# Patient Record
Sex: Female | Born: 1955 | Race: White | Hispanic: No | Marital: Married | Smoking: Former smoker
Health system: Southern US, Community
[De-identification: ages and names within clinical notes are randomized; demographics above are authoritative.]

## PROBLEM LIST (undated history)

## (undated) DIAGNOSIS — F419 Anxiety disorder, unspecified: Secondary | ICD-10-CM

## (undated) DIAGNOSIS — J45909 Unspecified asthma, uncomplicated: Secondary | ICD-10-CM

## (undated) DIAGNOSIS — M545 Low back pain, unspecified: Secondary | ICD-10-CM

## (undated) DIAGNOSIS — Z85048 Personal history of other malignant neoplasm of rectum, rectosigmoid junction, and anus: Secondary | ICD-10-CM

## (undated) DIAGNOSIS — G4700 Insomnia, unspecified: Secondary | ICD-10-CM

## (undated) HISTORY — PX: CERVICAL FUSION: SHX112

## (undated) HISTORY — PX: BREAST SURGERY: SHX581

## (undated) HISTORY — DX: Low back pain: M54.5

## (undated) HISTORY — DX: Insomnia, unspecified: G47.00

## (undated) HISTORY — DX: Personal history of other malignant neoplasm of rectum, rectosigmoid junction, and anus: Z85.048

## (undated) HISTORY — PX: RECTAL SURGERY: SHX760

## (undated) HISTORY — DX: Low back pain, unspecified: M54.50

## (undated) HISTORY — DX: Anxiety disorder, unspecified: F41.9

## (undated) HISTORY — PX: CATARACT EXTRACTION: SUR2

---

## 2011-12-11 ENCOUNTER — Encounter: Payer: Self-pay | Admitting: Internal Medicine

## 2011-12-11 DIAGNOSIS — C20 Malignant neoplasm of rectum: Secondary | ICD-10-CM

## 2012-01-22 ENCOUNTER — Encounter: Payer: PRIVATE HEALTH INSURANCE | Admitting: Hematology and Oncology

## 2012-01-27 DIAGNOSIS — Z452 Encounter for adjustment and management of vascular access device: Secondary | ICD-10-CM

## 2012-01-27 DIAGNOSIS — C20 Malignant neoplasm of rectum: Secondary | ICD-10-CM

## 2012-02-11 ENCOUNTER — Encounter (INDEPENDENT_AMBULATORY_CARE_PROVIDER_SITE_OTHER): Payer: PRIVATE HEALTH INSURANCE | Admitting: Internal Medicine

## 2012-02-11 DIAGNOSIS — G8929 Other chronic pain: Secondary | ICD-10-CM

## 2012-02-11 DIAGNOSIS — C210 Malignant neoplasm of anus, unspecified: Secondary | ICD-10-CM

## 2012-04-16 DIAGNOSIS — Z452 Encounter for adjustment and management of vascular access device: Secondary | ICD-10-CM

## 2012-04-16 DIAGNOSIS — C20 Malignant neoplasm of rectum: Secondary | ICD-10-CM

## 2012-05-31 DIAGNOSIS — C20 Malignant neoplasm of rectum: Secondary | ICD-10-CM

## 2012-05-31 DIAGNOSIS — Z452 Encounter for adjustment and management of vascular access device: Secondary | ICD-10-CM

## 2012-11-17 ENCOUNTER — Encounter: Payer: PRIVATE HEALTH INSURANCE | Admitting: Internal Medicine

## 2012-12-06 ENCOUNTER — Encounter: Payer: PRIVATE HEALTH INSURANCE | Admitting: Internal Medicine

## 2013-01-04 ENCOUNTER — Encounter (INDEPENDENT_AMBULATORY_CARE_PROVIDER_SITE_OTHER): Payer: PRIVATE HEALTH INSURANCE | Admitting: Internal Medicine

## 2014-12-06 ENCOUNTER — Emergency Department (HOSPITAL_COMMUNITY): Payer: Worker's Compensation

## 2014-12-06 ENCOUNTER — Encounter (HOSPITAL_COMMUNITY): Payer: Self-pay | Admitting: Emergency Medicine

## 2014-12-06 ENCOUNTER — Inpatient Hospital Stay (HOSPITAL_COMMUNITY)
Admission: EM | Admit: 2014-12-06 | Discharge: 2014-12-06 | DRG: 916 | Disposition: A | Discharge status: Home / Self Care | Payer: Worker's Compensation | Attending: Emergency Medicine | Admitting: Emergency Medicine

## 2014-12-06 DIAGNOSIS — F17210 Nicotine dependence, cigarettes, uncomplicated: Secondary | ICD-10-CM | POA: Diagnosis present

## 2014-12-06 DIAGNOSIS — Z720 Tobacco use: Secondary | ICD-10-CM | POA: Insufficient documentation

## 2014-12-06 DIAGNOSIS — I9589 Other hypotension: Secondary | ICD-10-CM | POA: Insufficient documentation

## 2014-12-06 DIAGNOSIS — R7989 Other specified abnormal findings of blood chemistry: Secondary | ICD-10-CM

## 2014-12-06 DIAGNOSIS — Z8249 Family history of ischemic heart disease and other diseases of the circulatory system: Secondary | ICD-10-CM

## 2014-12-06 DIAGNOSIS — T782XXA Anaphylactic shock, unspecified, initial encounter: Principal | ICD-10-CM | POA: Diagnosis present

## 2014-12-06 DIAGNOSIS — J45909 Unspecified asthma, uncomplicated: Secondary | ICD-10-CM | POA: Diagnosis present

## 2014-12-06 DIAGNOSIS — R778 Other specified abnormalities of plasma proteins: Secondary | ICD-10-CM | POA: Insufficient documentation

## 2014-12-06 DIAGNOSIS — M25559 Pain in unspecified hip: Secondary | ICD-10-CM

## 2014-12-06 HISTORY — DX: Unspecified asthma, uncomplicated: J45.909

## 2014-12-06 LAB — CBC WITH DIFFERENTIAL/PLATELET
BASOS PCT: 0 % (ref 0–1)
Basophils Absolute: 0 10*3/uL (ref 0.0–0.1)
Eosinophils Absolute: 0.1 10*3/uL (ref 0.0–0.7)
Eosinophils Relative: 0 % (ref 0–5)
HCT: 38.9 % (ref 36.0–46.0)
HEMOGLOBIN: 12.8 g/dL (ref 12.0–15.0)
LYMPHS ABS: 2.5 10*3/uL (ref 0.7–4.0)
Lymphocytes Relative: 22 % (ref 12–46)
MCH: 30.7 pg (ref 26.0–34.0)
MCHC: 32.9 g/dL (ref 30.0–36.0)
MCV: 93.3 fL (ref 78.0–100.0)
Monocytes Absolute: 0.4 10*3/uL (ref 0.1–1.0)
Monocytes Relative: 4 % (ref 3–12)
NEUTROS ABS: 8.6 10*3/uL — AB (ref 1.7–7.7)
Neutrophils Relative %: 74 % (ref 43–77)
Platelets: 295 10*3/uL (ref 150–400)
RBC: 4.17 MIL/uL (ref 3.87–5.11)
RDW: 13.5 % (ref 11.5–15.5)
WBC: 11.5 10*3/uL — ABNORMAL HIGH (ref 4.0–10.5)

## 2014-12-06 LAB — I-STAT CHEM 8, ED
BUN: 21 mg/dL — AB (ref 6–20)
CALCIUM ION: 1.16 mmol/L (ref 1.13–1.30)
Chloride: 104 mmol/L (ref 101–111)
Creatinine, Ser: 1 mg/dL (ref 0.44–1.00)
Glucose, Bld: 232 mg/dL — ABNORMAL HIGH (ref 70–99)
HEMATOCRIT: 40 % (ref 36.0–46.0)
Hemoglobin: 13.6 g/dL (ref 12.0–15.0)
Potassium: 3.3 mmol/L — ABNORMAL LOW (ref 3.5–5.1)
Sodium: 141 mmol/L (ref 135–145)
TCO2: 21 mmol/L (ref 0–100)

## 2014-12-06 LAB — RAPID URINE DRUG SCREEN, HOSP PERFORMED
Amphetamines: NOT DETECTED
Barbiturates: NOT DETECTED
Benzodiazepines: POSITIVE — AB
COCAINE: NOT DETECTED
OPIATES: NOT DETECTED
Tetrahydrocannabinol: NOT DETECTED

## 2014-12-06 LAB — BLOOD GAS, ARTERIAL
Acid-base deficit: 5.4 mmol/L — ABNORMAL HIGH (ref 0.0–2.0)
Bicarbonate: 19.8 mEq/L — ABNORMAL LOW (ref 20.0–24.0)
DRAWN BY: 25788
FIO2: 100 %
O2 SAT: 99 %
PCO2 ART: 40.8 mmHg (ref 35.0–45.0)
Patient temperature: 37
TCO2: 18.3 mmol/L (ref 0–100)
pH, Arterial: 7.306 — ABNORMAL LOW (ref 7.350–7.450)
pO2, Arterial: 273 mmHg — ABNORMAL HIGH (ref 80.0–100.0)

## 2014-12-06 LAB — COMPREHENSIVE METABOLIC PANEL
ALK PHOS: 43 U/L (ref 38–126)
ALT: 16 U/L (ref 14–54)
AST: 21 U/L (ref 15–41)
Albumin: 3.2 g/dL — ABNORMAL LOW (ref 3.5–5.0)
Anion gap: 8 (ref 5–15)
BUN: 21 mg/dL — ABNORMAL HIGH (ref 6–20)
CALCIUM: 8.9 mg/dL (ref 8.9–10.3)
CO2: 24 mmol/L (ref 22–32)
CREATININE: 0.97 mg/dL (ref 0.44–1.00)
Chloride: 107 mmol/L (ref 101–111)
GFR calc non Af Amer: >60 mL/min (ref >60)
GLUCOSE: 227 mg/dL — AB (ref 70–99)
Potassium: 3.7 mmol/L (ref 3.5–5.1)
Sodium: 139 mmol/L (ref 135–145)
Total Bilirubin: 0.4 mg/dL (ref 0.3–1.2)
Total Protein: 5.3 g/dL — ABNORMAL LOW (ref 6.5–8.1)

## 2014-12-06 LAB — URINALYSIS, ROUTINE W REFLEX MICROSCOPIC
BILIRUBIN URINE: NEGATIVE
Glucose, UA: 100 mg/dL — AB
Ketones, ur: NEGATIVE mg/dL
Nitrite: NEGATIVE
Protein, ur: NEGATIVE mg/dL
Specific Gravity, Urine: 1.02 (ref 1.005–1.030)
UROBILINOGEN UA: 0.2 mg/dL (ref 0.0–1.0)
pH: 5.5 (ref 5.0–8.0)

## 2014-12-06 LAB — TROPONIN I: Troponin I: <0.03 ng/mL (ref <0.031)

## 2014-12-06 LAB — I-STAT CG4 LACTIC ACID, ED: LACTIC ACID, VENOUS: 1.92 mmol/L (ref 0.5–2.0)

## 2014-12-06 LAB — URINE MICROSCOPIC-ADD ON

## 2014-12-06 LAB — PROTIME-INR
INR: 1.02 (ref 0.00–1.49)
Prothrombin Time: 13.5 seconds (ref 11.6–15.2)

## 2014-12-06 LAB — I-STAT TROPONIN, ED: Troponin i, poc: 0.15 ng/mL (ref 0.00–0.08)

## 2014-12-06 LAB — CK: Total CK: 94 U/L (ref 38–234)

## 2014-12-06 LAB — ACETAMINOPHEN LEVEL: Acetaminophen (Tylenol), Serum: <10 ug/mL — ABNORMAL LOW (ref 10–30)

## 2014-12-06 LAB — SALICYLATE LEVEL: Salicylate Lvl: <4 mg/dL (ref 2.8–30.0)

## 2014-12-06 LAB — ETHANOL: Alcohol, Ethyl (B): <5 mg/dL (ref <5)

## 2014-12-06 MED ORDER — EPINEPHRINE 0.3 MG/0.3ML IJ SOAJ
0.3000 mg | Freq: Once | INTRAMUSCULAR | Status: AC
  Administered 2014-12-06: 0.3 mg via INTRAMUSCULAR

## 2014-12-06 MED ORDER — PREDNISONE 50 MG PO TABS: ORAL_TABLET | ORAL | Status: DC

## 2014-12-06 MED ORDER — DIPHENHYDRAMINE HCL 50 MG/ML IJ SOLN
25.0000 mg | Freq: Once | INTRAMUSCULAR | Status: AC
  Administered 2014-12-06: 25 mg via INTRAVENOUS
  Filled 2014-12-06: qty 1

## 2014-12-06 MED ORDER — SODIUM CHLORIDE 0.9 % IV SOLN: INTRAVENOUS | Status: DC

## 2014-12-06 MED ORDER — SODIUM CHLORIDE 0.9 % IV BOLUS (SEPSIS)
1000.0000 mL | Freq: Once | INTRAVENOUS | Status: AC
  Administered 2014-12-06: 1000 mL via INTRAVENOUS

## 2014-12-06 MED ORDER — EPINEPHRINE 0.3 MG/0.3ML IJ SOAJ
INTRAMUSCULAR | Status: AC
  Filled 2014-12-06: qty 0.3

## 2014-12-06 MED ORDER — SODIUM CHLORIDE 0.9 % IV SOLN
Freq: Once | INTRAVENOUS | Status: AC
  Administered 2014-12-06: 14:00:00 via INTRAVENOUS

## 2014-12-06 MED ORDER — ASPIRIN 81 MG PO CHEW
324.0000 mg | CHEWABLE_TABLET | Freq: Once | ORAL | Status: AC
  Administered 2014-12-06: 324 mg via ORAL
  Filled 2014-12-06: qty 4

## 2014-12-06 MED ORDER — SODIUM CHLORIDE 0.9 % IV BOLUS (SEPSIS): 1000.0000 mL | Freq: Once | INTRAVENOUS | Status: DC

## 2014-12-06 MED ORDER — FAMOTIDINE IN NACL 20-0.9 MG/50ML-% IV SOLN
20.0000 mg | Freq: Once | INTRAVENOUS | Status: AC
Start: 2014-12-06 — End: 2014-12-06
  Administered 2014-12-06: 20 mg via INTRAVENOUS

## 2014-12-06 MED ORDER — EPINEPHRINE 0.3 MG/0.3ML IJ SOAJ: 0.3000 mg | Freq: Once | INTRAMUSCULAR | Status: AC

## 2014-12-06 MED ORDER — METHYLPREDNISOLONE SODIUM SUCC 125 MG IJ SOLR
INTRAMUSCULAR | Status: AC
  Filled 2014-12-06: qty 2

## 2014-12-06 MED ORDER — METHYLPREDNISOLONE SODIUM SUCC 125 MG IJ SOLR
125.0000 mg | Freq: Once | INTRAMUSCULAR | Status: AC
  Administered 2014-12-06: 125 mg via INTRAVENOUS

## 2014-12-06 MED ORDER — FAMOTIDINE IN NACL 20-0.9 MG/50ML-% IV SOLN
INTRAVENOUS | Status: AC
  Filled 2014-12-06: qty 50

## 2014-12-06 NOTE — ED Notes (Signed)
Pt was at a [employer] camp, was found outside minimally responsive. Pt with reddened skin, hypotension and lethargic. Pt received Benadryl and zofran en route with EMS.

## 2014-12-06 NOTE — ED Notes (Signed)
X-ray at bedside

## 2014-12-06 NOTE — ED Notes (Signed)
Gave pt blue paper scrubs to wear home.

## 2014-12-06 NOTE — ED Notes (Signed)
Went to inform patient about bed placement. Pt states she wants to go home. MD Marily Memos paged. States he will come down and see patient. Attempting to notify step-down.

## 2014-12-06 NOTE — ED Notes (Signed)
Respiratory switched pt from NRB to 4L [place]. Pt sats 100%

## 2014-12-06 NOTE — ED Provider Notes (Signed)
CSN: [id number]     Arrival date & time 12/06/14  1253 History   First MD Initiated Contact with Patient 12/06/14 1309     Chief Complaint  Patient presents with  . Allergic Reaction     (Consider location/radiation/quality/duration/timing/severity/associated sxs/prior Treatment) HPI Comments: level V caveat for acuity of condition. 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 She then developed itching in her skin with difficulty breathing. EMS was called and found her minimally responsive with hypotension and lethargy. They gave her Benadryl and Zofran. She arrives confused, hypotensive complaining of right hip pain. She denies falling. She denies any chest pain or shortness of breath. She denies any known allergies other than penicillin. She had abdominal pain with nausea vomiting and diarrhea. She feels she felt well previous to today.   The history is provided by the patient. The history is limited by the condition of the patient.    Past Medical History  Diagnosis Date  . Asthma    Past Surgical History  Procedure Laterality Date  . Cervical fusion     Family History  Problem Relation Age of Onset  . Heart failure Father   . Heart failure Mother    History  Substance Use Topics  . Smoking status: Current Every Day Smoker -- 1.00 packs/day    Types: Cigarettes  . Smokeless tobacco: Current User  . Alcohol Use: No   OB History    Gravida Para Term Preterm AB TAB SAB Ectopic Multiple Living   2 2 2             Review of Systems  Unable to perform ROS: Acuity of condition      Allergies  Levaquin; Rocephin; and Penicillins  Home Medications   Prior to Admission medications   Medication Sig Start Date End Date Taking? Authorizing Provider  acetaminophen (TYLENOL) 500 MG tablet Take 1,000 mg by mouth every 6 (six) hours as needed for mild pain or headache.   Yes Historical  Provider, MD  albuterol (PROVENTIL HFA;VENTOLIN HFA) 108 (90 BASE) MCG/ACT inhaler Inhale 2 puffs into the lungs every 6 (six) hours as needed for wheezing or shortness of breath.   Yes Historical Provider, MD  ALPRAZolam Duanne Moron) 1 MG tablet Take 1 mg by mouth at bedtime.   Yes Historical Provider, MD  Fluticasone-Salmeterol (ADVAIR) 250-50 MCG/DOSE AEPB Inhale 1 puff into the lungs 2 (two) times daily.   Yes Historical Provider, MD  HYDROcodone-acetaminophen (NORCO/VICODIN) 5-325 MG per tablet Take 1 tablet by mouth daily as needed for moderate pain.   Yes Historical Provider, MD  meclizine (ANTIVERT) 25 MG tablet Take 25 mg by mouth daily as needed for nausea.   Yes Historical Provider, MD  omeprazole (PRILOSEC) 20 MG capsule Take 20 mg by mouth daily as needed (acid reflux).   Yes Historical Provider, MD  traZODone (DESYREL) 100 MG tablet Take 100 mg by mouth at bedtime.   Yes Historical Provider, MD  EPINEPHrine 0.3 mg/0.3 mL IJ SOAJ injection Inject 0.3 mLs (0.3 mg total) into the muscle once. For severe allergic reaction 12/06/14   Ezequiel Essex, MD  predniSONE (DELTASONE) 50 MG tablet 1 tablet PO daily 12/06/14   Ezequiel Essex, MD   BP 104/57 mmHg  Pulse 88  Temp(Src) 97.8 F (36.6 C) (Oral)  Resp 20  Ht 5' (1.524 m)  Wt 124 lb (56.246 kg)  BMI 24.22 kg/m2  SpO2 98% Physical Exam  Constitutional: She is oriented to person, place, and time. She appears well-developed and well-nourished.  Distressed, erythematous skin, diaphoretic, hypotensive  HENT:  Head: Normocephalic and atraumatic.  Mouth/Throat: Oropharynx is clear and moist. No oropharyngeal exudate.  Oropharynx clear. Uvula midline. No tongue or lip swelling  Eyes: Conjunctivae and EOM are normal. Pupils are equal, round, and reactive to light.  Neck: Normal range of motion. Neck supple.  Cardiovascular: Normal rate and normal heart sounds.   Pulmonary/Chest: Effort normal and breath sounds normal. No respiratory  distress.  Abdominal: Soft. There is tenderness.  Incontinent of stool  Musculoskeletal: Normal range of motion. She exhibits tenderness. She exhibits no edema.  Tenderness with  palpation and range of motion of right hip  Neurological: She is alert and oriented to person, place, and time. No cranial nerve deficit. Coordination normal.  Skin: Skin is warm. Rash noted. She is diaphoretic.    ED Course  Procedures (including critical care time) Labs Review Labs Reviewed  CBC WITH DIFFERENTIAL/PLATELET - Abnormal; Notable for the following:    WBC 11.5 (*)    Neutro Abs 8.6 (*)    All other components within normal limits  COMPREHENSIVE METABOLIC PANEL - Abnormal; Notable for the following:    Glucose, Bld 227 (*)    BUN 21 (*)    Total Protein 5.3 (*)    Albumin 3.2 (*)    All other components within normal limits  URINALYSIS, ROUTINE W REFLEX MICROSCOPIC - Abnormal; Notable for the following:    Glucose, UA 100 (*)    Hgb urine dipstick TRACE (*)    Leukocytes, UA TRACE (*)    All other components within normal limits  ACETAMINOPHEN LEVEL - Abnormal; Notable for the following:    Acetaminophen (Tylenol), Serum <10 (*)    All other components within normal limits  URINE RAPID DRUG SCREEN (HOSP PERFORMED) - Abnormal; Notable for the following:    Benzodiazepines POSITIVE (*)    All other components within normal limits  BLOOD GAS, ARTERIAL - Abnormal; Notable for the following:    pH, Arterial 7.306 (*)    pO2, Arterial 273 (*)    Bicarbonate 19.8 (*)    Acid-base deficit 5.4 (*)    All other components within normal limits  URINE MICROSCOPIC-ADD ON - Abnormal; Notable for the following:    Squamous Epithelial / LPF FEW (*)    Bacteria, UA MANY (*)    All other components within normal limits  I-STAT CHEM 8, ED - Abnormal; Notable for the following:    Potassium 3.3 (*)    BUN 21 (*)    Glucose, Bld 232 (*)    All other components within normal limits  I-STAT TROPOININ,  ED - Abnormal; Notable for the following:    Troponin i, poc 0.15 (*)    All other components within normal limits  PROTIME-INR  SALICYLATE LEVEL  ETHANOL  CK  TROPONIN I  I-STAT CG4 LACTIC ACID, ED    Imaging Review Dg Chest Portable 1 View  12/06/2014   CLINICAL DATA:  Shortness of breath with hypotension.  EXAM: PORTABLE CHEST - 1 VIEW  COMPARISON:  Chest radiograph May 23, 2014 and chest CT September 04, 2014  FINDINGS: There is no appreciable edema or consolidation. Heart size and pulmonary vascularity are normal. No adenopathy. No bone lesions.  IMPRESSION: No edema or consolidation.   Electronically Signed   By: Lowella Grip III M.D.   On: 12/06/2014  14:07   Dg Hip Port Unilat With Pelvis 1v Right  12/06/2014   CLINICAL DATA:  Right hip pain.  Unsure of trauma history.  EXAM: RIGHT HIP (WITH PELVIS) 3 VIEW PORTABLE  COMPARISON:  None.  FINDINGS: Frontal pelvis as well as frontal and lateral right hip images were obtained. There is no demonstrable acute fracture or dislocation. Joint spaces appear intact. No erosive change.  IMPRESSION: No fracture or dislocation.  No appreciable arthropathy.   Electronically Signed   By: Lowella Grip III M.D.   On: 12/06/2014 14:04     EKG Interpretation   Date/Time:  Wednesday Dec 06 2014 13:07:07 EDT Ventricular Rate:  89 PR Interval:  148 QRS Duration: 88 QT Interval:  393 QTC Calculation: 478 R Axis:   68 Text Interpretation:  Sinus rhythm Minimal ST depression, inferior leads  No previous ECGs available Confirmed by Wyvonnia Dusky  MD, Hadley Soileau [phone]) on  12/06/2014 1:27:42 PM      MDM   Final diagnoses:  Hip pain  Anaphylactic shock, initial encounter   Patient with concern for anaphylaxis and shock. She is given IM epinephrine, fluids, steroids, antihistamines.  Patient has improved with above therapy. Blood pressure has normalized after becoming hypertensive. Chest x-rays negative. She is not wheezing. Airway is intact.  EKG is normal sinus rhythm. Troponin mildly elevated at 0.15. She denies any chest pain or shortness of breath.  Lab troponin is negative.  Suspect lab irregularity versus mild demand ischemia from her reaction.  No EKG changes.  BP has normalized  Patient feels at her baseline. She denies SOB or CP.    patient adamantly refusing admission. Discussed at bedside. Dr. Marily Memos the hospitalist has discussed with her as well. We believe she is at risk for rebound anaphylactic reaction. Her troponin is positive indicating some myocardial damage from previous reaction. She states she lives 5 minutes from the hospital and wishes to go home. She understands she could be at risk for decompensation and death. She appears to have capacity to make medical decisions and will leave [place].   She'll be given epinephrine pen and steroids. Encouraged to return if she changes her mind or develops recurring symptoms.   CRITICAL CARE Performed by: Ezequiel Essex Total critical care time: 45 Critical care time was exclusive of separately billable procedures and treating other patients. Critical care was necessary to treat or prevent imminent or life-threatening deterioration. Critical care was time spent personally by me on the following activities: development of treatment plan with patient and/or surrogate as well as nursing, discussions with consultants, evaluation of patient's response to treatment, examination of patient, obtaining history from patient or surrogate, ordering and performing treatments and interventions, ordering and review of laboratory studies, ordering and review of radiographic studies, pulse oximetry and re-evaluation of patient's condition.    Ezequiel Essex, MD 12/07/14 [phone]

## 2014-12-06 NOTE — Consult Note (Signed)
[facility] Consult Note  Annette Chavez XBW:[id number] DOB: Dec 09, 1955 DOA: 12/06/2014  Referring physician: Dr Wyvonnia Dusky - [facility] PCP: No PCP Per Patient   Chief Complaint: Anaphylaxis   HPI: Annette Chavez is a 59 y.o. female  Patient reports acute onset symptoms earlier today. Patient reports walking outside and pulling a few did flowers off of a bush. A few minutes later patient developed significant tingling in her fingers and toes and lips. Digits appeared change colors. Patient reports speaking to somebody about this as she started to "feel funny and lethargic. "Patient states she does not remember anything else after that point in time. Per patient's husband and report patient became minimally responsive at that point in time EMS was called and found her to be severely hypotensive. Patient was unable to give a reliable history that point time. Per my evaluation in the emergency room patient is status post epinephrine, steroids, 3 L normal saline bolus, Benadryl. Patient is significantly improved and feels at her baseline. Patient is without complaint at this point in time.  She reports being in her normal state of health prior to this event.  Review of Systems:  Constitutional:  No weight loss, night sweats, Fevers, chills, fatigue.  HEENT:  No headaches, Difficulty swallowing,Tooth/dental problems,Sore throat,  No sneezing, itching, ear ache, nasal congestion, post nasal drip,  Cardio-vascular:  No chest pain, Orthopnea, PND, swelling in lower extremities, anasarca, dizziness, GI:  No heartburn, indigestion, abdominal pain, nausea, vomiting, diarrhea, change in bowel habits, loss of appetite  Resp:   No excess mucus, no productive cough, No non-productive cough, No coughing up of blood.No change in color of mucus.No wheezing.No chest wall deformity  Skin:  no rash or lesions.  GU:  no dysuria, change in color of urine, no urgency or frequency. No flank pain.    Musculoskeletal:   No joint pain or swelling. No decreased range of motion. No back pain.  Psych:  No change in mood or affect. No depression or anxiety. No memory loss.   Past Medical History  Diagnosis Date  . Asthma    Past Surgical History  Procedure Laterality Date  . Cervical fusion     Social History:  reports that she has been smoking Cigarettes.  She has been smoking about 1.00 pack per day. She does not have any smokeless tobacco history on file. She reports that she does not drink alcohol or use illicit drugs.  Allergies  Allergen Reactions  . Levaquin [Levofloxacin]     High blood pressure, convulsing   . Rocephin [Ceftriaxone]     High blood pressure, convulsing  . Penicillins Rash    No family history on file.   Prior to Admission medications   Medication Sig Start Date End Date Taking? Authorizing Provider  acetaminophen (TYLENOL) 500 MG tablet Take 1,000 mg by mouth every 6 (six) hours as needed for mild pain or headache.   Yes Historical Provider, MD  albuterol (PROVENTIL HFA;VENTOLIN HFA) 108 (90 BASE) MCG/ACT inhaler Inhale 2 puffs into the lungs every 6 (six) hours as needed for wheezing or shortness of breath.   Yes Historical Provider, MD  ALPRAZolam Duanne Moron) 1 MG tablet Take 1 mg by mouth at bedtime.   Yes Historical Provider, MD  Fluticasone-Salmeterol (ADVAIR) 250-50 MCG/DOSE AEPB Inhale 1 puff into the lungs 2 (two) times daily.   Yes Historical Provider, MD  HYDROcodone-acetaminophen (NORCO/VICODIN) 5-325 MG per tablet Take 1 tablet by mouth daily as needed for moderate pain.  Yes Historical Provider, MD  meclizine (ANTIVERT) 25 MG tablet Take 25 mg by mouth daily as needed for nausea.   Yes Historical Provider, MD  omeprazole (PRILOSEC) 20 MG capsule Take 20 mg by mouth daily as needed (acid reflux).   Yes Historical Provider, MD  traZODone (DESYREL) 100 MG tablet Take 100 mg by mouth at bedtime.   Yes Historical Provider, MD   Physical Exam: Filed  Vitals:   12/06/14 1445 12/06/14 1500 12/06/14 1509 12/06/14 1515  BP: 110/54 108/53 108/53 107/56  Pulse: 91 90 91 91  Temp:      TempSrc:      Resp: 20 18 18 18   Height:      Weight:      SpO2: 100% 100% 99% 100%    Wt Readings from Last 3 Encounters:  12/06/14 56.246 kg (124 lb)    General:  Appears calm and comfortable Eyes:  PERRL, normal lids, irises & conjunctiva ENT: Dry mucous membranes Neck:  no LAD, masses or thyromegaly Cardiovascular:  RRR, no m/r/g. No LE edema. Telemetry:  SR, no arrhythmias  Respiratory:  CTA bilaterally, no w/r/r. Normal respiratory effort. Abdomen:  soft, ntnd Skin:  no rash or induration seen on limited exam Musculoskeletal:  grossly normal tone BUE/BLE Psychiatric:  grossly normal mood and affect, speech fluent and appropriate Neurologic:  grossly non-focal.          Labs on Admission:  Basic Metabolic Panel:  Recent Labs Lab 12/06/14 1328 12/06/14 1329  NA 139 141  K 3.7 3.3*  CL 107 104  CO2 24  --   GLUCOSE 227* 232*  BUN 21* 21*  CREATININE 0.97 1.00  CALCIUM 8.9  --    Liver Function Tests:  Recent Labs Lab 12/06/14 1328  AST 21  ALT 16  ALKPHOS 43  BILITOT 0.4  PROT 5.3*  ALBUMIN 3.2*   No results for input(s): LIPASE, AMYLASE in the last 168 hours. No results for input(s): AMMONIA in the last 168 hours. CBC:  Recent Labs Lab 12/06/14 1328 12/06/14 1329  WBC 11.5*  --   NEUTROABS 8.6*  --   HGB 12.8 13.6  HCT 38.9 40.0  MCV 93.3  --   PLT 295  --    Cardiac Enzymes:  Recent Labs Lab 12/06/14 1328  CKTOTAL 94  TROPONINI <0.03    BNP (last 3 results) No results for input(s): BNP in the last 8760 hours.  ProBNP (last 3 results) No results for input(s): PROBNP in the last 8760 hours.  CBG: No results for input(s): GLUCAP in the last 168 hours.  Radiological Exams on Admission: Dg Chest Portable 1 View  12/06/2014   CLINICAL DATA:  Shortness of breath with hypotension.  EXAM: PORTABLE  CHEST - 1 VIEW  COMPARISON:  Chest radiograph May 23, 2014 and chest CT September 04, 2014  FINDINGS: There is no appreciable edema or consolidation. Heart size and pulmonary vascularity are normal. No adenopathy. No bone lesions.  IMPRESSION: No edema or consolidation.   Electronically Signed   By: Lowella Grip III M.D.   On: 12/06/2014 14:07   Dg Hip Port Unilat With Pelvis 1v Right  12/06/2014   CLINICAL DATA:  Right hip pain.  Unsure of trauma history.  EXAM: RIGHT HIP (WITH PELVIS) 3 VIEW PORTABLE  COMPARISON:  None.  FINDINGS: Frontal pelvis as well as frontal and lateral right hip images were obtained. There is no demonstrable acute fracture or dislocation. Joint spaces appear intact. No  erosive change.  IMPRESSION: No fracture or dislocation.  No appreciable arthropathy.   Electronically Signed   By: Lowella Grip III M.D.   On: 12/06/2014 14:04    EKG: Independently reviewed. Normal sinus rhythm, no sign of ACS.  Initial EKG from presentation showed nonspecific ST changes and sinus rhythm.  Assessment/Plan Active Problems:   * No active hospital problems. *  Anaphylactic reaction: Patient evaluated and presenting with symptoms of classic severe anaphylaxis. Patient's second anaphylaxis reaction and seems to be worse than the first one per patient and family report. Unknown etiology of the cause but symptoms were rapidly progressive. Patient responded very well to antihistamine, steroid, epinephrine, fluid bolus. Patient appears to be stable and at her baseline at this point in time. Patient is very adamant about wanting to go home. Reiterated multiple times by myself and with ED physician Dr. Kingsley Callander about the seriousness of her reaction and the danger poses to her health and life. Patient clearly understands the risk of death if her symptoms return without having immediate medical attention. Patient requesting to go home [place].   Of note patient with an initial  elevated point-of-care troponin but second serum troponin was normal. A repeat EKG was obtained which showed normal sinus rhythm and without any sign of ACS. Anticipate a falsely elevated point-of-care troponin versus minimal cardiac strain secondary to anaphylaxis. Patient to follow-up with her primary care physician regarding this.  Patient to be discharged home with steroids and an EpiPen.  Family Communication: Husband Disposition Plan: home  Nakiea Metzner Lenna Sciara, MD Family Medicine [facility] [email or web address] Password [email or web address]

## 2014-12-06 NOTE — ED Notes (Signed)
Respiratory at bedside for ABG

## 2014-12-06 NOTE — ED Notes (Signed)
Per Co-worker, pt was out working in the flower bed when she came it and stated that her fingers were tingling. Pt applied hand-sanitizer and then stated that she felt like her lips were tingling. Co-worker states pt ambulated to bathroom and began calling out for help. EMS was then called. Pt and co-worker deny any LOC.

## 2014-12-06 NOTE — ED Notes (Signed)
Pt is becoming more alert. Pt AOx4

## 2014-12-06 NOTE — ED Notes (Signed)
Oxygen dropped to 2L [place] by RT. Oxygen 99% on 2L.

## 2014-12-06 NOTE — ED Notes (Signed)
MD Rancour and MD Marily Memos at bedside.

## 2014-12-06 NOTE — ED Notes (Signed)
Pt's rings removed my RN Tiffany and RN Margreta Journey given to pt's husband.

## 2014-12-06 NOTE — Discharge Instructions (Signed)
Anaphylactic Reaction You are leaving against medical advice.  You may be at risk for recurrent allergic reaction or death. Return to the ED if you wish to be reevaluated. An anaphylactic reaction is a sudden, severe allergic reaction that involves the whole body. It can be life threatening. A hospital stay is often required. People with asthma, eczema, or hay fever are slightly more likely to have an anaphylactic reaction. CAUSES  An anaphylactic reaction may be caused by anything to which you are allergic. After being exposed to the allergic substance, your immune system becomes sensitized to it. When you are exposed to that allergic substance again, an allergic reaction can occur. Common causes of an anaphylactic reaction include:  Medicines.  Foods, especially peanuts, wheat, shellfish, milk, and eggs.  Insect bites or stings.  Blood products.  Chemicals, such as dyes, latex, and contrast material used for imaging tests. SYMPTOMS  When an allergic reaction occurs, the body releases histamine and other substances. These substances cause symptoms such as tightening of the airway. Symptoms often develop within seconds or minutes of exposure. Symptoms may include:  Skin rash or hives.  Itching.  Chest tightness.  Swelling of the eyes, tongue, or lips.  Trouble breathing or swallowing.  Lightheadedness or fainting.  Anxiety or confusion.  Stomach pains, vomiting, or diarrhea.  Nasal congestion.  A fast or irregular heartbeat (palpitations). DIAGNOSIS  Diagnosis is based on your history of recent exposure to allergic substances, your symptoms, and a physical exam. Your caregiver may also perform blood or urine tests to confirm the diagnosis. TREATMENT  Epinephrine medicine is the main treatment for an anaphylactic reaction. Other medicines that may be used for treatment include antihistamines, steroids, and albuterol. In severe cases, fluids and medicine to support blood  pressure may be given through an intravenous line (IV). Even if you improve after treatment, you need to be observed to make sure your condition does not get worse. This may require a stay in the hospital. [place] a medical alert bracelet or necklace stating your allergy.  You and your family must learn how to use an anaphylaxis kit or give an epinephrine injection to temporarily treat an emergency allergic reaction. Always carry your epinephrine injection or anaphylaxis kit with you. This can be lifesaving if you have a severe reaction.  Do not drive or perform tasks after treatment until the medicines used to treat your reaction have worn off, or until your caregiver says it is okay.  If you have hives or a rash:  Take medicines as directed by your caregiver.  You may use an over-the-counter antihistamine (diphenhydramine) as needed.  Apply cold compresses to the skin or take baths in cool water. Avoid hot baths or showers. SEEK MEDICAL CARE IF:   You develop symptoms of an allergic reaction to a new substance. Symptoms may start right away or minutes later.  You develop a rash, hives, or itching.  You develop new symptoms. SEEK IMMEDIATE MEDICAL CARE IF:   You have swelling of the mouth, difficulty breathing, or wheezing.  You have a tight feeling in your chest or throat.  You develop hives, swelling, or itching all over your body.  You develop severe vomiting or diarrhea.  You feel faint or pass out. This is an emergency. Use your epinephrine injection or anaphylaxis kit as you have been instructed. Call your local emergency services (911 in [place] Even if you improve after the injection, you need to be examined  at a hospital emergency department. MAKE SURE YOU:   Understand these instructions.  Will watch your condition.  Will get help right away if you are not doing well or get worse. Document Released: 07/14/2005 Document Revised: 07/19/2013  Document Reviewed: 10/15/2011 [facility] Patient Information 2015 [place], [place]. This information is not intended to replace advice given to you by your health care provider. Make sure you discuss any questions you have with your health care provider.

## 2016-07-04 IMAGING — CR DG CHEST 1V PORT
1 series · 1 of 1 positions shown · non-contrast
Comparison: Chest radiograph May 23, 2014 and chest CT September 04, 2014

CLINICAL DATA: Shortness of breath with hypotension.

EXAM:
PORTABLE CHEST - 1 VIEW

[ap portable]
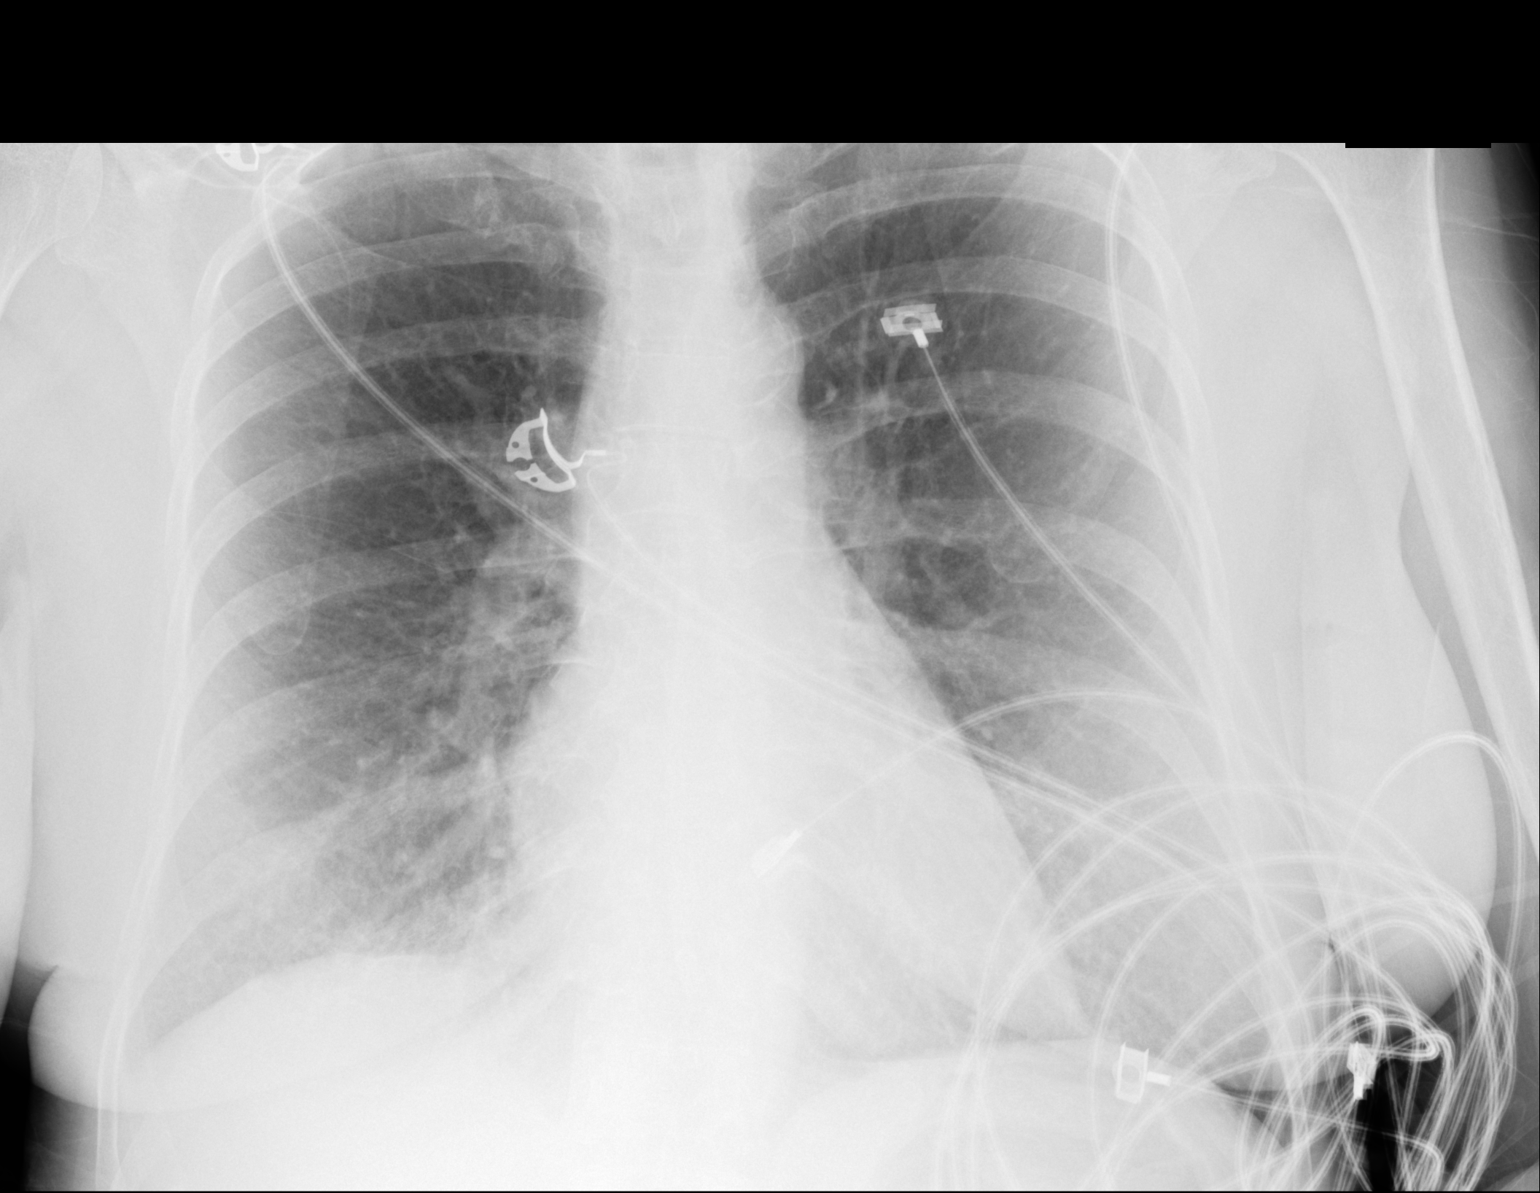

[1 of 1 positions shown; findings below may reference images not displayed]

FINDINGS: There is no appreciable edema or consolidation. Heart size and
pulmonary vascularity are normal. No adenopathy. No bone lesions.
IMPRESSION: No edema or consolidation.

## 2017-09-04 ENCOUNTER — Encounter: Payer: Self-pay | Admitting: Cardiology

## 2017-09-04 NOTE — Progress Notes (Signed)
Cardiology Office Note  Date: 09/07/2017   ID: Rashay, Barnette 1956/07/09, MRN [id number]  PCP: Rosine Door  Consulting cardiologist: Rozann Lesches, MD   Chief Complaint  Patient presents with  . Abnormal GXT    History of Present Illness: Annette Chavez is a 62 y.o. female referred for cardiology consultation by Ms. Skillman PA-C for the assessment of chest pain.  She is here today with her husband.  She tells me that a few months ago she was sitting on her bed talking with her children when she suddenly began to feel a tightness in her chest radiating to the right arm.  This lasted for 10 minutes, she felt weak.  Since then she has not had similar symptoms but has had a much lighter squeezing sensation in her chest that she has attributed to emotional stress.  She recently underwent a GXT on January 14 at [facility].  Study report indicates abnormal ST segment changes at maximum workload of 7 METS, unclear if she experienced chest pain.  Blood pressure response was normal.  There were no arrhythmias.  I personally reviewed copy of ECG from July 15, 2017 which showed normal sinus rhythm.  Current medications are reviewed below.  She does have a family history of premature CAD.  Past Medical History:  Diagnosis Date  . Anxiety   . Asthma   . History of rectal cancer   . Insomnia   . Low back pain     Past Surgical History:  Procedure Laterality Date  . BREAST SURGERY    . CATARACT EXTRACTION Bilateral   . CERVICAL FUSION    . RECTAL SURGERY      Current Outpatient Medications  Medication Sig Dispense Refill  . acetaminophen (TYLENOL) 500 MG tablet Take 1,000 mg by mouth every 6 (six) hours as needed for mild pain or headache.    . albuterol (PROVENTIL HFA;VENTOLIN HFA) 108 (90 BASE) MCG/ACT inhaler Inhale 2 puffs into the lungs every 6 (six) hours as needed for wheezing or shortness of breath.    . ALPRAZolam (XANAX) 1  MG tablet Take 1 mg by mouth at bedtime.    Marland Kitchen EPINEPHrine 0.3 mg/0.3 mL IJ SOAJ injection Inject 0.3 mLs (0.3 mg total) into the muscle once. For severe allergic reaction 1 Device 1  . Fluticasone-Salmeterol (ADVAIR) 250-50 MCG/DOSE AEPB Inhale 1 puff into the lungs 2 (two) times daily.    Marland Kitchen HYDROcodone-acetaminophen (NORCO/VICODIN) 5-325 MG per tablet Take 1 tablet by mouth daily as needed for moderate pain.    Marland Kitchen ipratropium-albuterol (DUONEB) 0.5-2.5 (3) MG/3ML SOLN Take 3 mLs by nebulization.    Rulon Sera IN Inhale into the lungs.    . meclizine (ANTIVERT) 25 MG tablet Take 25 mg by mouth daily as needed for nausea.    . mometasone (NASONEX) 50 MCG/ACT nasal spray Place 2 sprays into the nose daily.    Marland Kitchen omeprazole (PRILOSEC) 20 MG capsule Take 20 mg by mouth daily as needed (acid reflux).    . traZODone (DESYREL) 100 MG tablet Take 100 mg by mouth at bedtime.     No current facility-administered medications for this visit.    Allergies:  Levaquin [levofloxacin]; Rocephin [ceftriaxone]; and Penicillins   Social History: The patient  reports that she has been smoking cigarettes.  She has been smoking about 1.00 pack per day. She uses smokeless tobacco. She reports that she does not drink alcohol or use drugs.  Family History: The patient's family history includes Heart attack in her father and mother.   ROS:  Please see the history of present illness. Otherwise, complete review of systems is positive for anxiety.  All other systems are reviewed and negative.   Physical Exam: VS:  BP 102/62   Pulse 83   Ht 5\' 3"  (1.6 m)   Wt 130 lb (59 kg)   SpO2 98%   BMI 23.03 kg/m , BMI Body mass index is 23.03 kg/m.  Wt Readings from Last 3 Encounters:  09/07/17 130 lb (59 kg)  12/06/14 124 lb (56.2 kg)    General: Patient appears comfortable at rest. HEENT: Conjunctiva and lids normal, oropharynx clear. Neck: Supple, no elevated JVP or carotid bruits, no thyromegaly. Lungs:  Clear to auscultation, nonlabored breathing at rest. Cardiac: Regular rate and rhythm, no S3 or significant systolic murmur, no pericardial rub. Abdomen: Soft, nontender, bowel sounds present, no guarding or rebound. Extremities: No pitting edema, distal pulses 2+. Skin: Warm and dry. Musculoskeletal: No kyphosis. Neuropsychiatric: Alert and oriented x3, affect grossly appropriate.  ECG: I personally reviewed the tracing from 12/06/2014 which showed sinus rhythm with nonspecific ST changes.  Recent Labwork:  December 2018: Cholesterol 189, triglycerides 88, HDL 44, LDL 107, hemoglobin 11.5, platelets 228, BUN 11, creatinine 0.68, potassium 4.1, AST 22, ALT 25, TSH 1.72  Assessment and Plan:  1.  Recurring atypical chest pain following a more intense episode that occurred a few months ago at rest.  Screening GXT was abnormal at [facility] in January.  She has a family history of premature CAD.  We have discussed further diagnostic options and plan is to proceed with a diagnostic cardiac catheterization.  She has reviewed the risks and benefits and is in agreement to proceed.  2.  Family history of premature CAD in both parents.  3.  Anxiety.  Patient states that she worries about "everything."  She is also concerned that emotional stress may have been causing some of her symptoms.  4.  Tobacco abuse.  Smoking cessation discussed.  Current medicines were reviewed with the patient today.  Disposition: Follow-up after procedure.  Signed, Satira Sark, MD, [facility] 09/07/2017 11:23 AM    [facility] [place], [place], [place] [address] Phone: [phone]; Fax: [phone]

## 2017-09-04 NOTE — H&P (View-Only) (Signed)
Cardiology Office Note  Date: 09/07/2017   ID: Annette, Chavez 1956/02/13, MRN [id number]  PCP: Rosine Door  Consulting cardiologist: Rozann Lesches, MD   Chief Complaint  Patient presents with  . Abnormal GXT    History of Present Illness: Annette Chavez is a 62 y.o. female referred for cardiology consultation by Ms. Skillman PA-C for the assessment of chest pain.  She is here today with her husband.  She tells me that a few months ago she was sitting on her bed talking with her children when she suddenly began to feel a tightness in her chest radiating to the right arm.  This lasted for 10 minutes, she felt weak.  Since then she has not had similar symptoms but has had a much lighter squeezing sensation in her chest that she has attributed to emotional stress.  She recently underwent a GXT on January 14 at [facility].  Study report indicates abnormal ST segment changes at maximum workload of 7 METS, unclear if she experienced chest pain.  Blood pressure response was normal.  There were no arrhythmias.  I personally reviewed copy of ECG from July 15, 2017 which showed normal sinus rhythm.  Current medications are reviewed below.  She does have a family history of premature CAD.  Past Medical History:  Diagnosis Date  . Anxiety   . Asthma   . History of rectal cancer   . Insomnia   . Low back pain     Past Surgical History:  Procedure Laterality Date  . BREAST SURGERY    . CATARACT EXTRACTION Bilateral   . CERVICAL FUSION    . RECTAL SURGERY      Current Outpatient Medications  Medication Sig Dispense Refill  . acetaminophen (TYLENOL) 500 MG tablet Take 1,000 mg by mouth every 6 (six) hours as needed for mild pain or headache.    . albuterol (PROVENTIL HFA;VENTOLIN HFA) 108 (90 BASE) MCG/ACT inhaler Inhale 2 puffs into the lungs every 6 (six) hours as needed for wheezing or shortness of breath.    . ALPRAZolam (XANAX) 1  MG tablet Take 1 mg by mouth at bedtime.    Marland Kitchen EPINEPHrine 0.3 mg/0.3 mL IJ SOAJ injection Inject 0.3 mLs (0.3 mg total) into the muscle once. For severe allergic reaction 1 Device 1  . Fluticasone-Salmeterol (ADVAIR) 250-50 MCG/DOSE AEPB Inhale 1 puff into the lungs 2 (two) times daily.    Marland Kitchen HYDROcodone-acetaminophen (NORCO/VICODIN) 5-325 MG per tablet Take 1 tablet by mouth daily as needed for moderate pain.    Marland Kitchen ipratropium-albuterol (DUONEB) 0.5-2.5 (3) MG/3ML SOLN Take 3 mLs by nebulization.    Rulon Sera IN Inhale into the lungs.    . meclizine (ANTIVERT) 25 MG tablet Take 25 mg by mouth daily as needed for nausea.    . mometasone (NASONEX) 50 MCG/ACT nasal spray Place 2 sprays into the nose daily.    Marland Kitchen omeprazole (PRILOSEC) 20 MG capsule Take 20 mg by mouth daily as needed (acid reflux).    . traZODone (DESYREL) 100 MG tablet Take 100 mg by mouth at bedtime.     No current facility-administered medications for this visit.    Allergies:  Levaquin [levofloxacin]; Rocephin [ceftriaxone]; and Penicillins   Social History: The patient  reports that she has been smoking cigarettes.  She has been smoking about 1.00 pack per day. She uses smokeless tobacco. She reports that she does not drink alcohol or use drugs.  Family History: The patient's family history includes Heart attack in her father and mother.   ROS:  Please see the history of present illness. Otherwise, complete review of systems is positive for anxiety.  All other systems are reviewed and negative.   Physical Exam: VS:  BP 102/62   Pulse 83   Ht 5\' 3"  (1.6 m)   Wt 130 lb (59 kg)   SpO2 98%   BMI 23.03 kg/m , BMI Body mass index is 23.03 kg/m.  Wt Readings from Last 3 Encounters:  09/07/17 130 lb (59 kg)  12/06/14 124 lb (56.2 kg)    General: Patient appears comfortable at rest. HEENT: Conjunctiva and lids normal, oropharynx clear. Neck: Supple, no elevated JVP or carotid bruits, no thyromegaly. Lungs:  Clear to auscultation, nonlabored breathing at rest. Cardiac: Regular rate and rhythm, no S3 or significant systolic murmur, no pericardial rub. Abdomen: Soft, nontender, bowel sounds present, no guarding or rebound. Extremities: No pitting edema, distal pulses 2+. Skin: Warm and dry. Musculoskeletal: No kyphosis. Neuropsychiatric: Alert and oriented x3, affect grossly appropriate.  ECG: I personally reviewed the tracing from 12/06/2014 which showed sinus rhythm with nonspecific ST changes.  Recent Labwork:  December 2018: Cholesterol 189, triglycerides 88, HDL 44, LDL 107, hemoglobin 11.5, platelets 228, BUN 11, creatinine 0.68, potassium 4.1, AST 22, ALT 25, TSH 1.72  Assessment and Plan:  1.  Recurring atypical chest pain following a more intense episode that occurred a few months ago at rest.  Screening GXT was abnormal at [facility] in January.  She has a family history of premature CAD.  We have discussed further diagnostic options and plan is to proceed with a diagnostic cardiac catheterization.  She has reviewed the risks and benefits and is in agreement to proceed.  2.  Family history of premature CAD in both parents.  3.  Anxiety.  Patient states that she worries about "everything."  She is also concerned that emotional stress may have been causing some of her symptoms.  4.  Tobacco abuse.  Smoking cessation discussed.  Current medicines were reviewed with the patient today.  Disposition: Follow-up after procedure.  Signed, Satira Sark, MD, [facility] 09/07/2017 11:23 AM    [facility] [place], [place], [place] [address] Phone: [phone]; Fax: [phone]

## 2017-09-07 ENCOUNTER — Telehealth: Payer: Self-pay | Admitting: Cardiology

## 2017-09-07 ENCOUNTER — Encounter: Payer: Self-pay | Admitting: Cardiology

## 2017-09-07 ENCOUNTER — Other Ambulatory Visit: Payer: Self-pay | Admitting: Cardiology

## 2017-09-07 ENCOUNTER — Ambulatory Visit: Payer: PRIVATE HEALTH INSURANCE | Admitting: Cardiology

## 2017-09-07 VITALS — BP 102/62 | HR 83 | Ht 63.0 in | Wt 130.0 lb

## 2017-09-07 DIAGNOSIS — R9430 Abnormal result of cardiovascular function study, unspecified: Secondary | ICD-10-CM | POA: Diagnosis not present

## 2017-09-07 DIAGNOSIS — R0789 Other chest pain: Secondary | ICD-10-CM | POA: Diagnosis not present

## 2017-09-07 DIAGNOSIS — Z720 Tobacco use: Secondary | ICD-10-CM

## 2017-09-07 DIAGNOSIS — Z8249 Family history of ischemic heart disease and other diseases of the circulatory system: Secondary | ICD-10-CM

## 2017-09-07 NOTE — Patient Instructions (Signed)
Your physician recommends that you schedule a follow-up appointment in: [place]  Your physician recommends that you continue on your current medications as directed. Please refer to the Current Medication list given to you today.    [place] [address] Dept: [phone] Loc: [phone]  Annette Chavez  09/07/2017  You are scheduled for a Cardiac Catheterization on Friday, February 15 with Dr. Larae Grooms.  1. Please arrive at the [facility] (Main Entrance A) at [facility]: [place], [place] [address] at 8:30 AM (two hours before your procedure to ensure your preparation). Free valet parking service is available.   Special note: Every effort is made to have your procedure done on time. Please understand that emergencies sometimes delay scheduled procedures.  2. Diet: Do not eat or drink anything after midnight prior to your procedure except sips of water to take medications.  3. Labs: Your labs will be performed at the hospital after you arrive for your procedure.  4. Medication instructions in preparation for your procedure:   *For reference purposes while preparing patient instructions.   Delete this med list prior to printing instructions for patient.*    On the morning of your procedure, take your morning medicines NOT listed above.  You may use sips of water.  5. Plan for one night stay--bring personal belongings. 6. Bring a current list of your medications and current insurance cards. 7. You MUST have a responsible person to drive you home. 8. Someone MUST be with you the first 24 hours after you arrive home or your discharge will be delayed. 9. Please wear clothes that are easy to get on and off and wear slip-on shoes.  Thank you for allowing [place] to care for you!   -- [facility]

## 2017-09-07 NOTE — Telephone Encounter (Signed)
Pre-cert Verification for the following procedure   [facility] 2/15 @10 :30AM DR Irish Lack

## 2017-09-10 ENCOUNTER — Telehealth: Payer: Self-pay | Admitting: *Deleted

## 2017-09-10 NOTE — Telephone Encounter (Signed)
Patient attempted to contact Shellee Milo RN back and was transferred to [facility] office. Went over instructions that were in this encounter. Patient verbalized understanding and will have someone there to drive her. Patient stated she had already informed cath team that she will not be able to be there until 8:30. Patient stated if there was any new information she needed to be given then she can be reached at [phone]

## 2017-09-10 NOTE — Telephone Encounter (Signed)
Heart Cath scheduled at [facility] for: Friday February 15,2019 at 10:30 AM Arrival time and place: [place] at: 8 AM  AM meds can be  taken pre-cath with sip of water including: ASA 81 mg am of cath  Confirm patient has responsible person to drive home post procedure and observe patient for 24 hours  LMTCB to discuss with patient.

## 2017-09-11 ENCOUNTER — Encounter (HOSPITAL_COMMUNITY): Payer: Self-pay | Admitting: Interventional Cardiology

## 2017-09-11 ENCOUNTER — Ambulatory Visit (HOSPITAL_COMMUNITY)
Admission: RE | Admit: 2017-09-11 | Discharge: 2017-09-11 | Disposition: A | Discharge status: Home / Self Care | Payer: PRIVATE HEALTH INSURANCE | Source: Ambulatory Visit | Attending: Interventional Cardiology | Admitting: Interventional Cardiology

## 2017-09-11 ENCOUNTER — Ambulatory Visit (HOSPITAL_COMMUNITY)
Admission: RE | Disposition: A | Discharge status: Home / Self Care | Payer: Self-pay | Source: Ambulatory Visit | Attending: Interventional Cardiology

## 2017-09-11 DIAGNOSIS — F17210 Nicotine dependence, cigarettes, uncomplicated: Secondary | ICD-10-CM | POA: Diagnosis not present

## 2017-09-11 DIAGNOSIS — J45909 Unspecified asthma, uncomplicated: Secondary | ICD-10-CM | POA: Diagnosis not present

## 2017-09-11 DIAGNOSIS — R9430 Abnormal result of cardiovascular function study, unspecified: Secondary | ICD-10-CM | POA: Diagnosis not present

## 2017-09-11 DIAGNOSIS — Z7951 Long term (current) use of inhaled steroids: Secondary | ICD-10-CM | POA: Diagnosis not present

## 2017-09-11 DIAGNOSIS — I2510 Atherosclerotic heart disease of native coronary artery without angina pectoris: Secondary | ICD-10-CM | POA: Insufficient documentation

## 2017-09-11 DIAGNOSIS — R9439 Abnormal result of other cardiovascular function study: Secondary | ICD-10-CM | POA: Diagnosis present

## 2017-09-11 DIAGNOSIS — F419 Anxiety disorder, unspecified: Secondary | ICD-10-CM | POA: Diagnosis not present

## 2017-09-11 DIAGNOSIS — G4700 Insomnia, unspecified: Secondary | ICD-10-CM | POA: Diagnosis not present

## 2017-09-11 DIAGNOSIS — Z880 Allergy status to penicillin: Secondary | ICD-10-CM | POA: Insufficient documentation

## 2017-09-11 DIAGNOSIS — Z8249 Family history of ischemic heart disease and other diseases of the circulatory system: Secondary | ICD-10-CM | POA: Diagnosis not present

## 2017-09-11 HISTORY — PX: LEFT HEART CATH AND CORONARY ANGIOGRAPHY: CATH118249

## 2017-09-11 LAB — PROTIME-INR
INR: 0.9
Prothrombin Time: 12.1 seconds (ref 11.4–15.2)

## 2017-09-11 LAB — BASIC METABOLIC PANEL
Anion gap: 10 (ref 5–15)
BUN: 10 mg/dL (ref 6–20)
CHLORIDE: 106 mmol/L (ref 101–111)
CO2: 26 mmol/L (ref 22–32)
CREATININE: 0.71 mg/dL (ref 0.44–1.00)
Calcium: 9 mg/dL (ref 8.9–10.3)
GFR calc Af Amer: >60 mL/min (ref >60)
GFR calc non Af Amer: >60 mL/min (ref >60)
Glucose, Bld: 91 mg/dL (ref 65–99)
Potassium: 4.2 mmol/L (ref 3.5–5.1)
SODIUM: 142 mmol/L (ref 135–145)

## 2017-09-11 LAB — CBC
HEMATOCRIT: 36.8 % (ref 36.0–46.0)
Hemoglobin: 12.3 g/dL (ref 12.0–15.0)
MCH: 30.1 pg (ref 26.0–34.0)
MCHC: 33.4 g/dL (ref 30.0–36.0)
MCV: 90 fL (ref 78.0–100.0)
PLATELETS: 223 10*3/uL (ref 150–400)
RBC: 4.09 MIL/uL (ref 3.87–5.11)
RDW: 13.9 % (ref 11.5–15.5)
WBC: 7.3 10*3/uL (ref 4.0–10.5)

## 2017-09-11 SURGERY — LEFT HEART CATH AND CORONARY ANGIOGRAPHY
Anesthesia: LOCAL

## 2017-09-11 MED ORDER — FENTANYL CITRATE (PF) 100 MCG/2ML IJ SOLN
INTRAMUSCULAR | Status: DC | PRN
  Administered 2017-09-11 (×3): 25 ug via INTRAVENOUS

## 2017-09-11 MED ORDER — ASPIRIN 81 MG PO CHEW
81.0000 mg | CHEWABLE_TABLET | ORAL | Status: AC
  Administered 2017-09-11: 81 mg via ORAL

## 2017-09-11 MED ORDER — HEPARIN SODIUM (PORCINE) 1000 UNIT/ML IJ SOLN
INTRAMUSCULAR | Status: AC
  Filled 2017-09-11: qty 1

## 2017-09-11 MED ORDER — SODIUM CHLORIDE 0.9% FLUSH: 3.0000 mL | INTRAVENOUS | Status: DC | PRN

## 2017-09-11 MED ORDER — HEPARIN (PORCINE) IN NACL 2-0.9 UNIT/ML-% IJ SOLN
INTRAMUSCULAR | Status: AC
  Filled 2017-09-11: qty 1000

## 2017-09-11 MED ORDER — SODIUM CHLORIDE 0.9 % IV SOLN: 250.0000 mL | INTRAVENOUS | Status: DC | PRN

## 2017-09-11 MED ORDER — IOPAMIDOL (ISOVUE-370) INJECTION 76%
INTRAVENOUS | Status: AC
  Filled 2017-09-11: qty 100

## 2017-09-11 MED ORDER — MIDAZOLAM HCL 2 MG/2ML IJ SOLN
INTRAMUSCULAR | Status: AC
  Filled 2017-09-11: qty 2

## 2017-09-11 MED ORDER — SODIUM CHLORIDE 0.9% FLUSH: 3.0000 mL | Freq: Two times a day (BID) | INTRAVENOUS | Status: DC

## 2017-09-11 MED ORDER — SODIUM CHLORIDE 0.9 % IV SOLN: INTRAVENOUS | Status: AC

## 2017-09-11 MED ORDER — FENTANYL CITRATE (PF) 100 MCG/2ML IJ SOLN
INTRAMUSCULAR | Status: AC
  Filled 2017-09-11: qty 2

## 2017-09-11 MED ORDER — LIDOCAINE HCL (PF) 1 % IJ SOLN
INTRAMUSCULAR | Status: DC | PRN
  Administered 2017-09-11 (×2): 1 mL via SUBCUTANEOUS

## 2017-09-11 MED ORDER — HEPARIN (PORCINE) IN NACL 2-0.9 UNIT/ML-% IJ SOLN
INTRAMUSCULAR | Status: AC | PRN
  Administered 2017-09-11: 1000 mL

## 2017-09-11 MED ORDER — VERAPAMIL HCL 2.5 MG/ML IV SOLN
INTRAVENOUS | Status: AC
  Filled 2017-09-11: qty 2

## 2017-09-11 MED ORDER — SODIUM CHLORIDE 0.9 % WEIGHT BASED INFUSION
3.0000 mL/kg/h | INTRAVENOUS | Status: AC
  Administered 2017-09-11: 3 mL/kg/h via INTRAVENOUS

## 2017-09-11 MED ORDER — SODIUM CHLORIDE 0.9 % WEIGHT BASED INFUSION: 1.0000 mL/kg/h | INTRAVENOUS | Status: DC

## 2017-09-11 MED ORDER — HEPARIN SODIUM (PORCINE) 1000 UNIT/ML IJ SOLN
INTRAMUSCULAR | Status: DC | PRN
  Administered 2017-09-11: 3000 [IU] via INTRAVENOUS

## 2017-09-11 MED ORDER — MIDAZOLAM HCL 2 MG/2ML IJ SOLN
INTRAMUSCULAR | Status: DC | PRN
  Administered 2017-09-11: 2 mg via INTRAVENOUS
  Administered 2017-09-11 (×2): 1 mg via INTRAVENOUS

## 2017-09-11 MED ORDER — VERAPAMIL HCL 2.5 MG/ML IV SOLN
INTRAVENOUS | Status: DC | PRN
  Administered 2017-09-11: 10 mL via INTRA_ARTERIAL

## 2017-09-11 MED ORDER — ASPIRIN 81 MG PO CHEW
CHEWABLE_TABLET | ORAL | Status: AC
  Filled 2017-09-11: qty 1

## 2017-09-11 MED ORDER — IOPAMIDOL (ISOVUE-370) INJECTION 76%
INTRAVENOUS | Status: DC | PRN
  Administered 2017-09-11: 50 mL via INTRA_ARTERIAL

## 2017-09-11 MED ORDER — LIDOCAINE HCL 1 % IJ SOLN
INTRAMUSCULAR | Status: AC
  Filled 2017-09-11: qty 20

## 2017-09-11 SURGICAL SUPPLY — 14 items
CATH INFINITI 4FR JL3.5 (CATHETERS) ×2 IMPLANT
CATH INFINITI 5 FR JL3.5 (CATHETERS) ×2 IMPLANT
CATH INFINITI JR4 5F (CATHETERS) ×2 IMPLANT
COVER PRB 48X5XTLSCP FOLD TPE (BAG) ×1 IMPLANT
COVER PROBE 5X48 (BAG) ×1
DEVICE RAD COMP TR BAND LRG (VASCULAR PRODUCTS) ×2 IMPLANT
GLIDESHEATH SLEND SS 6F .021 (SHEATH) ×2 IMPLANT
GUIDEWIRE INQWIRE 1.5J.035X260 (WIRE) ×1 IMPLANT
INQWIRE 1.5J .035X260CM (WIRE) ×2
KIT HEART LEFT (KITS) ×2 IMPLANT
PACK CARDIAC CATHETERIZATION (CUSTOM PROCEDURE TRAY) ×2 IMPLANT
TRANSDUCER W/STOPCOCK (MISCELLANEOUS) ×2 IMPLANT
TUBING CIL FLEX 10 FLL-RA (TUBING) ×2 IMPLANT
WIRE HI TORQ VERSACORE-J 145CM (WIRE) ×2 IMPLANT

## 2017-09-11 NOTE — Discharge Instructions (Signed)
**Note Annette Chavez-identified via Obfuscation** Radial Site Care °Refer to this sheet in the next few weeks. These instructions provide you with information about caring for yourself after your procedure. Your health care provider may also give you more specific instructions. Your treatment has been planned according to current medical practices, but problems sometimes occur. Call your health care provider if you have any problems or questions after your procedure. °What can I expect after the procedure? °After your procedure, it is typical to have the following: °· Bruising at the radial site that usually fades within 1-2 weeks. °· Blood collecting in the tissue (hematoma) that may be painful to the touch. It should usually decrease in size and tenderness within 1-2 weeks. ° °Follow these instructions at home: °· Take medicines only as directed by your health care provider. °· You may shower 24-48 hours after the procedure or as directed by your health care provider. Remove the bandage (dressing) and gently wash the site with plain soap and water. Pat the area dry with a clean towel. Do not rub the site, because this may cause bleeding. °· Do not take baths, swim, or use a hot tub until your health care provider approves. °· Check your insertion site every day for redness, swelling, or drainage. °· Do not apply powder or lotion to the site. °· Do not flex or bend the affected arm for 24 hours or as directed by your health care provider. °· Do not push or pull heavy objects with the affected arm for 24 hours or as directed by your health care provider. °· Do not lift over 10 lb (4.5 kg) for 5 days after your procedure or as directed by your health care provider. °· Ask your health care provider when it is okay to: °? Return to work or school. °? Resume usual physical activities or sports. °? Resume sexual activity. °· Do not drive home if you are discharged the same day as the procedure. Have someone else drive you. °· You may drive 24 hours after the procedure  unless otherwise instructed by your health care provider. °· Do not operate machinery or power tools for 24 hours after the procedure. °· If your procedure was done as an outpatient procedure, which means that you went home the same day as your procedure, a responsible adult should be with you for the first 24 hours after you arrive home. °· Keep all follow-up visits as directed by your health care provider. This is important. °Contact a health care provider if: °· You have a fever. °· You have chills. °· You have increased bleeding from the radial site. Hold pressure on the site. °Get help right away if: °· You have unusual pain at the radial site. °· You have redness, warmth, or swelling at the radial site. °· You have drainage (other than a small amount of blood on the dressing) from the radial site. °· The radial site is bleeding, and the bleeding does not stop after 30 minutes of holding steady pressure on the site. °· Your arm or hand becomes pale, cool, tingly, or numb. °This information is not intended to replace advice given to you by your health care provider. Make sure you discuss any questions you have with your health care provider. °Document Released: 08/16/2010 Document Revised: 12/20/2015 Document Reviewed: 01/30/2014 °Elsevier Interactive Patient Education © 2018 [employer] ° ° ° °Moderate Conscious Sedation, Adult, Care After °These instructions provide you with information about caring for yourself after your procedure. Your health care provider  **Note Annette Chavez-identified via Obfuscation** may also give you more specific instructions. Your treatment has been planned according to current medical practices, but problems sometimes occur. Call your health care provider if you have any problems or questions after your procedure. °What can I expect after the procedure? °After your procedure, it is common: °· To feel sleepy for several hours. °· To feel clumsy and have poor balance for several hours. °· To have poor judgment for several  hours. °· To vomit if you eat too soon. ° °Follow these instructions at home: °For at least 24 hours after the procedure: ° °· Do not: °? Participate in activities where you could fall or become injured. °? Drive. °? Use heavy machinery. °? Drink alcohol. °? Take sleeping pills or medicines that cause drowsiness. °? Make important decisions or sign legal documents. °? Take care of children on your own. °· Rest. °Eating and drinking °· Follow the diet recommended by your health care provider. °· If you vomit: °? Drink water, juice, or soup when you can drink without vomiting. °? Make sure you have little or no nausea before eating solid foods. °General instructions °· Have a responsible adult stay with you until you are awake and alert. °· Take over-the-counter and prescription medicines only as told by your health care provider. °· If you smoke, do not smoke without supervision. °· Keep all follow-up visits as told by your health care provider. This is important. °Contact a health care provider if: °· You keep feeling nauseous or you keep vomiting. °· You feel light-headed. °· You develop a rash. °· You have a fever. °Get help right away if: °· You have trouble breathing. °This information is not intended to replace advice given to you by your health care provider. Make sure you discuss any questions you have with your health care provider. °Document Released: 05/04/2013 Document Revised: 12/17/2015 Document Reviewed: 11/03/2015 °Elsevier Interactive Patient Education © 2018 Elsevier Inc. ° °

## 2017-09-11 NOTE — Research (Signed)
CADFEM Informed Consent   Subject Name: Annette Chavez  Subject met inclusion and exclusion criteria.  The informed consent form, study requirements and expectations were reviewed with the subject and questions and concerns were addressed prior to the signing of the consent form.  The subject verbalized understanding of the trail requirements.  The subject agreed to participate in the CADFEM trial and signed the informed consent.  The informed consent was obtained prior to performance of any protocol-specific procedures for the subject.  A copy of the signed informed consent was given to the subject and a copy was placed in the subject's medical record.  Philemon Kingdom D 09/11/2017, 0905 AM

## 2017-09-11 NOTE — Interval H&P Note (Signed)
Cath Lab Visit (complete for each Cath Lab visit)  Clinical Evaluation Leading to the Procedure:   ACS: No.  Non-ACS:    Anginal Classification: CCS III  Anti-ischemic medical therapy: Minimal Therapy (1 class of medications)  Non-Invasive Test Results: Intermediate-risk stress test findings: cardiac mortality 1-3%/year  Prior CABG: No previous CABG      History and Physical Interval Note:  09/11/2017 9:59 AM  Annette Chavez  has presented today for surgery, with the diagnosis of cp  The various methods of treatment have been discussed with the patient and family. After consideration of risks, benefits and other options for treatment, the patient has consented to  Procedure(s): LEFT HEART CATH AND CORONARY ANGIOGRAPHY (N/A) as a surgical intervention .  The patient's history has been reviewed, patient examined, no change in status, stable for surgery.  I have reviewed the patient's chart and labs.  Questions were answered to the patient's satisfaction.     Larae Grooms

## 2017-09-14 MED FILL — Lidocaine HCl Local Inj 1%: INTRAMUSCULAR | Qty: 20 | Status: AC

## 2017-09-14 MED FILL — Heparin Sodium (Porcine) 2 Unit/ML in Sodium Chloride 0.9%: INTRAMUSCULAR | Qty: 1000 | Status: AC

## 2017-10-01 ENCOUNTER — Encounter: Payer: Self-pay | Admitting: Cardiology

## 2017-10-01 ENCOUNTER — Ambulatory Visit (INDEPENDENT_AMBULATORY_CARE_PROVIDER_SITE_OTHER): Payer: PRIVATE HEALTH INSURANCE | Admitting: Cardiology

## 2017-10-01 VITALS — BP 114/60 | HR 90 | Ht 63.0 in | Wt 133.6 lb

## 2017-10-01 DIAGNOSIS — Z720 Tobacco use: Secondary | ICD-10-CM | POA: Diagnosis not present

## 2017-10-01 DIAGNOSIS — Z8249 Family history of ischemic heart disease and other diseases of the circulatory system: Secondary | ICD-10-CM | POA: Diagnosis not present

## 2017-10-01 DIAGNOSIS — I2510 Atherosclerotic heart disease of native coronary artery without angina pectoris: Secondary | ICD-10-CM

## 2017-10-01 MED ORDER — ASPIRIN EC 81 MG PO TBEC: 81.0000 mg | DELAYED_RELEASE_TABLET | Freq: Every day | ORAL | 3 refills | Status: AC

## 2017-10-01 NOTE — Progress Notes (Signed)
Cardiology Office Note  Date: 10/01/2017   ID: Annette Chavez, DOB 06-28-1956, MRN [id number]  PCP: Rosine Door  Primary Cardiologist: Rozann Lesches, MD   Chief Complaint  Patient presents with  . Follow-up cardiac catheterization    History of Present Illness: Annette Chavez is a 62 y.o. female seen in consultation in February and referred for a diagnostic cardiac catheterization in the setting of atypical chest pain and abnormal screening GXT at [facility].  Procedure was performed by Dr. Irish Lack and fortunately revealed nonobstructive coronary atherosclerosis, most significant lesion was approximately 60% second obtuse marginal, no clear targets for PCI.  Medical therapy was recommended.  She presents with her husband for a follow-up visit.  States that she is getting over the flu but is feeling better.  She does not report any progressive chest pain or shortness of breath.  We discussed the results of her cardiac catheterization and plan for risk factor modification strategies at this time.  We discussed taking a coated baby aspirin daily, also having her lipids checked per PCP.  Statin therapy would be a recommendation in general.  Past Medical History:  Diagnosis Date  . Anxiety   . Asthma   . History of rectal cancer   . Insomnia   . Low back pain     Past Surgical History:  Procedure Laterality Date  . BREAST SURGERY    . CATARACT EXTRACTION Bilateral   . CERVICAL FUSION    . LEFT HEART CATH AND CORONARY ANGIOGRAPHY N/A 09/11/2017   Procedure: LEFT HEART CATH AND CORONARY ANGIOGRAPHY;  Surgeon: Jettie Booze, MD;  Location: [facility];  Service: Cardiovascular;  Laterality: N/A;  . RECTAL SURGERY      Current Outpatient Medications  Medication Sig Dispense Refill  . acetaminophen (TYLENOL) 500 MG tablet Take 500-1,000 mg by mouth every 6 (six) hours as needed for mild pain or headache.     . albuterol  (PROVENTIL HFA;VENTOLIN HFA) 108 (90 BASE) MCG/ACT inhaler Inhale 2 puffs into the lungs every 6 (six) hours as needed for wheezing or shortness of breath.    . ALPRAZolam (XANAX) 1 MG tablet Take 1 mg by mouth 3 (three) times daily as needed for anxiety.     Marland Kitchen EPINEPHrine 0.3 mg/0.3 mL IJ SOAJ injection Inject 0.3 mLs (0.3 mg total) into the muscle once. For severe allergic reaction 1 Device 1  . fluticasone-salmeterol (ADVAIR HFA) 115-21 MCG/ACT inhaler Inhale 2 puffs into the lungs 2 (two) times daily.     Marland Kitchen ibuprofen (ADVIL,MOTRIN) 800 MG tablet Take 800 mg by mouth every 8 (eight) hours as needed for headache or moderate pain.    Marland Kitchen ipratropium-albuterol (DUONEB) 0.5-2.5 (3) MG/3ML SOLN Take 3 mLs by nebulization every 6 (six) hours as needed (for shortness of breath or wheezing).     . mometasone (NASONEX) 50 MCG/ACT nasal spray Place 2 sprays into the nose daily as needed (for allergies).     Marland Kitchen omeprazole (PRILOSEC) 20 MG capsule Take 20 mg by mouth daily as needed (acid reflux).    Marland Kitchen OVER THE COUNTER MEDICATION Apply 1 application topically daily as needed (for muscle pain). [place] Gel    . aspirin EC 81 MG tablet Take 1 tablet (81 mg total) by mouth daily. 30 tablet 3   No current facility-administered medications for this visit.    Allergies:  Levaquin [levofloxacin]; Rocephin [ceftriaxone]; and Penicillins   Social History: The patient  reports that she has been smoking cigarettes.  She has been smoking about 1.00 pack per day. She uses smokeless tobacco. She reports that she does not drink alcohol or use drugs.   ROS:  Please see the history of present illness. Otherwise, complete review of systems is positive for none.  All other systems are reviewed and negative.   Physical Exam: VS:  BP 114/60   Pulse 90   Ht 5\' 3"  (1.6 m)   Wt 133 lb 9.6 oz (60.6 kg)   SpO2 98%   BMI 23.67 kg/m , BMI Body mass index is 23.67 kg/m.  Wt Readings from Last 3 Encounters:  10/01/17 133 lb 9.6  oz (60.6 kg)  09/11/17 130 lb (59 kg)  09/07/17 130 lb (59 kg)    General: Patient appears comfortable at rest. HEENT: Conjunctiva and lids normal, oropharynx clear with moist mucosa. Neck: Supple, no elevated JVP or carotid bruits, no thyromegaly. Lungs: Clear to auscultation, nonlabored breathing at rest. Cardiac: Regular rate and rhythm, no S3 or significant systolic murmur. Abdomen: Soft, nontender, bowel sounds present, no guarding or rebound. Extremities: No pitting edema, distal pulses 2+. Skin: Warm and dry. Musculoskeletal: No kyphosis. Neuropsychiatric: Alert and oriented x3, affect grossly appropriate.  ECG: I personally reviewed the tracing from 07/15/2017 which showed sinus rhythm.  Recent Labwork: 09/11/2017: BUN 10; Creatinine, Ser 0.71; Hemoglobin 12.3; Platelets 223; Potassium 4.2; Sodium 142   Other Studies Reviewed Today:  Cardiac catheterization 09/11/2017:  Prox RCA to Mid RCA lesion is 25% stenosed.  Ost 2nd Mrg lesion is 60% stenosed.  Prox Cx to Mid Cx lesion is 25% stenosed.  Prox LAD lesion is 30% stenosed.  The left ventricular systolic function is normal.  LV end diastolic pressure is normal.  The left ventricular ejection fraction is 55-65% by visual estimate.  There is no aortic valve stenosis.   Nonobstructive disease in the LAD.    Branch vessel disease in the ostial OM, not a high risk lesion.  Not a good target for PCI.  Would medically manage.  Would not use right radial in the future, due to severe spasm.  She needs to stop smoking.  Assessment and Plan:  1.  Nonobstructive coronary atherosclerosis documented by recent cardiac catheterization.  Most significant lesion was approximately 60% branch vessel disease and a second obtuse marginal.  At this point would recommend medical therapy and risk factor modification.  We discussed starting aspirin 81 mg daily, also get lipid panel from PCP with consideration for statin therapy.   Smoking cessation will also be very important.  2.  Tobacco abuse.  Smoking cessation discussed.  3.  Family history of premature CAD in both parents.  Current medicines were reviewed with the patient today.  Disposition: Follow-up in 1 year.  Signed, Satira Sark, MD, [facility] 10/01/2017 1:20 PM    [facility] [place], [place], [place] [address] Phone: [phone]; Fax: [phone]

## 2017-10-01 NOTE — Patient Instructions (Signed)
Medication Instructions:  Your physician has recommended you make the following change in your medication:   START Aspirin 81 mg daily  Please continue all other medications as prescribed  Labwork: NONE  Testing/Procedures: NONE  Follow-Up: Your physician wants you to follow-up in: [place]. You will receive a reminder letter in the mail two months in advance. If you don't receive a letter, please call our office to schedule the follow-up appointment.  Any Other Special Instructions Will Be Listed Below (If Applicable).  If you need a refill on your cardiac medications before your next appointment, please call your pharmacy.

## 2018-12-30 DIAGNOSIS — G4700 Insomnia, unspecified: Secondary | ICD-10-CM | POA: Diagnosis not present

## 2018-12-30 DIAGNOSIS — Z720 Tobacco use: Secondary | ICD-10-CM | POA: Diagnosis not present

## 2018-12-30 DIAGNOSIS — K219 Gastro-esophageal reflux disease without esophagitis: Secondary | ICD-10-CM | POA: Diagnosis not present

## 2019-02-25 DIAGNOSIS — Z6822 Body mass index (BMI) 22.0-22.9, adult: Secondary | ICD-10-CM | POA: Diagnosis not present

## 2019-02-25 DIAGNOSIS — R300 Dysuria: Secondary | ICD-10-CM | POA: Diagnosis not present

## 2019-03-14 DIAGNOSIS — Z6822 Body mass index (BMI) 22.0-22.9, adult: Secondary | ICD-10-CM | POA: Diagnosis not present

## 2019-03-14 DIAGNOSIS — R300 Dysuria: Secondary | ICD-10-CM | POA: Diagnosis not present

## 2019-04-19 DIAGNOSIS — R300 Dysuria: Secondary | ICD-10-CM | POA: Diagnosis not present

## 2019-04-19 DIAGNOSIS — R822 Biliuria: Secondary | ICD-10-CM | POA: Diagnosis not present

## 2019-04-19 DIAGNOSIS — R05 Cough: Secondary | ICD-10-CM | POA: Diagnosis not present

## 2019-06-11 DIAGNOSIS — R300 Dysuria: Secondary | ICD-10-CM | POA: Diagnosis not present

## 2019-06-11 DIAGNOSIS — Z6823 Body mass index (BMI) 23.0-23.9, adult: Secondary | ICD-10-CM | POA: Diagnosis not present

## 2019-07-27 DIAGNOSIS — D649 Anemia, unspecified: Secondary | ICD-10-CM | POA: Diagnosis not present

## 2019-07-27 DIAGNOSIS — K219 Gastro-esophageal reflux disease without esophagitis: Secondary | ICD-10-CM | POA: Diagnosis not present

## 2019-08-02 DIAGNOSIS — Z6822 Body mass index (BMI) 22.0-22.9, adult: Secondary | ICD-10-CM | POA: Diagnosis not present

## 2019-08-02 DIAGNOSIS — Z720 Tobacco use: Secondary | ICD-10-CM | POA: Diagnosis not present

## 2019-08-02 DIAGNOSIS — K219 Gastro-esophageal reflux disease without esophagitis: Secondary | ICD-10-CM | POA: Diagnosis not present

## 2019-08-02 DIAGNOSIS — Z0000 Encounter for general adult medical examination without abnormal findings: Secondary | ICD-10-CM | POA: Diagnosis not present

## 2019-08-02 DIAGNOSIS — R300 Dysuria: Secondary | ICD-10-CM | POA: Diagnosis not present

## 2019-09-08 DIAGNOSIS — K219 Gastro-esophageal reflux disease without esophagitis: Secondary | ICD-10-CM | POA: Diagnosis not present

## 2019-09-08 DIAGNOSIS — G4700 Insomnia, unspecified: Secondary | ICD-10-CM | POA: Diagnosis not present

## 2019-09-08 DIAGNOSIS — Z6822 Body mass index (BMI) 22.0-22.9, adult: Secondary | ICD-10-CM | POA: Diagnosis not present

## 2019-09-08 DIAGNOSIS — Z720 Tobacco use: Secondary | ICD-10-CM | POA: Diagnosis not present

## 2019-09-28 DIAGNOSIS — H524 Presbyopia: Secondary | ICD-10-CM | POA: Diagnosis not present

## 2019-11-14 DIAGNOSIS — H26493 Other secondary cataract, bilateral: Secondary | ICD-10-CM | POA: Diagnosis not present

## 2019-11-14 DIAGNOSIS — Z961 Presence of intraocular lens: Secondary | ICD-10-CM | POA: Diagnosis not present

## 2019-11-25 DIAGNOSIS — Z961 Presence of intraocular lens: Secondary | ICD-10-CM | POA: Diagnosis not present

## 2020-03-08 DIAGNOSIS — Z6823 Body mass index (BMI) 23.0-23.9, adult: Secondary | ICD-10-CM | POA: Diagnosis not present

## 2020-03-08 DIAGNOSIS — R300 Dysuria: Secondary | ICD-10-CM | POA: Diagnosis not present

## 2020-03-08 DIAGNOSIS — Z1389 Encounter for screening for other disorder: Secondary | ICD-10-CM | POA: Diagnosis not present

## 2020-03-08 DIAGNOSIS — K219 Gastro-esophageal reflux disease without esophagitis: Secondary | ICD-10-CM | POA: Diagnosis not present

## 2020-03-08 DIAGNOSIS — G4700 Insomnia, unspecified: Secondary | ICD-10-CM | POA: Diagnosis not present

## 2020-03-08 DIAGNOSIS — Z720 Tobacco use: Secondary | ICD-10-CM | POA: Diagnosis not present

## 2020-04-18 DIAGNOSIS — J441 Chronic obstructive pulmonary disease with (acute) exacerbation: Secondary | ICD-10-CM | POA: Diagnosis not present

## 2020-04-25 DIAGNOSIS — J441 Chronic obstructive pulmonary disease with (acute) exacerbation: Secondary | ICD-10-CM | POA: Diagnosis not present

## 2020-05-11 DIAGNOSIS — H9202 Otalgia, left ear: Secondary | ICD-10-CM | POA: Diagnosis not present

## 2020-05-11 DIAGNOSIS — J029 Acute pharyngitis, unspecified: Secondary | ICD-10-CM | POA: Diagnosis not present

## 2020-05-11 DIAGNOSIS — R0981 Nasal congestion: Secondary | ICD-10-CM | POA: Diagnosis not present

## 2020-07-04 DIAGNOSIS — J441 Chronic obstructive pulmonary disease with (acute) exacerbation: Secondary | ICD-10-CM | POA: Diagnosis not present

## 2020-07-23 DIAGNOSIS — R1030 Lower abdominal pain, unspecified: Secondary | ICD-10-CM | POA: Diagnosis not present

## 2020-07-23 DIAGNOSIS — F17200 Nicotine dependence, unspecified, uncomplicated: Secondary | ICD-10-CM | POA: Diagnosis not present

## 2020-07-23 DIAGNOSIS — K3532 Acute appendicitis with perforation and localized peritonitis, without abscess: Secondary | ICD-10-CM | POA: Diagnosis not present

## 2020-07-23 DIAGNOSIS — R000 Tachycardia, unspecified: Secondary | ICD-10-CM | POA: Diagnosis not present

## 2020-07-23 DIAGNOSIS — K37 Unspecified appendicitis: Secondary | ICD-10-CM | POA: Diagnosis not present

## 2020-07-23 DIAGNOSIS — J449 Chronic obstructive pulmonary disease, unspecified: Secondary | ICD-10-CM | POA: Diagnosis not present

## 2020-07-23 DIAGNOSIS — K3533 Acute appendicitis with perforation and localized peritonitis, with abscess: Secondary | ICD-10-CM | POA: Diagnosis not present

## 2020-07-23 DIAGNOSIS — Z7951 Long term (current) use of inhaled steroids: Secondary | ICD-10-CM | POA: Diagnosis not present

## 2020-07-23 DIAGNOSIS — K8020 Calculus of gallbladder without cholecystitis without obstruction: Secondary | ICD-10-CM | POA: Diagnosis not present

## 2020-07-23 DIAGNOSIS — Z5329 Procedure and treatment not carried out because of patient's decision for other reasons: Secondary | ICD-10-CM | POA: Diagnosis not present

## 2020-07-23 DIAGNOSIS — Z9089 Acquired absence of other organs: Secondary | ICD-10-CM | POA: Diagnosis not present

## 2020-07-23 DIAGNOSIS — K3580 Unspecified acute appendicitis: Secondary | ICD-10-CM | POA: Diagnosis not present

## 2020-07-23 DIAGNOSIS — Z9221 Personal history of antineoplastic chemotherapy: Secondary | ICD-10-CM | POA: Diagnosis not present

## 2020-07-23 DIAGNOSIS — Z87440 Personal history of urinary (tract) infections: Secondary | ICD-10-CM | POA: Diagnosis not present

## 2020-07-23 DIAGNOSIS — Z923 Personal history of irradiation: Secondary | ICD-10-CM | POA: Diagnosis not present

## 2020-07-23 DIAGNOSIS — Z880 Allergy status to penicillin: Secondary | ICD-10-CM | POA: Diagnosis not present

## 2020-07-23 DIAGNOSIS — R110 Nausea: Secondary | ICD-10-CM | POA: Diagnosis not present

## 2020-07-23 DIAGNOSIS — R1031 Right lower quadrant pain: Secondary | ICD-10-CM | POA: Diagnosis not present

## 2020-07-23 DIAGNOSIS — Z6823 Body mass index (BMI) 23.0-23.9, adult: Secondary | ICD-10-CM | POA: Diagnosis not present

## 2020-07-23 DIAGNOSIS — Z85048 Personal history of other malignant neoplasm of rectum, rectosigmoid junction, and anus: Secondary | ICD-10-CM | POA: Diagnosis not present

## 2020-07-27 DIAGNOSIS — R109 Unspecified abdominal pain: Secondary | ICD-10-CM | POA: Diagnosis not present

## 2020-08-01 DIAGNOSIS — G8918 Other acute postprocedural pain: Secondary | ICD-10-CM | POA: Diagnosis not present

## 2020-08-01 DIAGNOSIS — Z9049 Acquired absence of other specified parts of digestive tract: Secondary | ICD-10-CM | POA: Diagnosis not present

## 2020-08-01 DIAGNOSIS — R109 Unspecified abdominal pain: Secondary | ICD-10-CM | POA: Diagnosis not present

## 2020-08-01 DIAGNOSIS — J90 Pleural effusion, not elsewhere classified: Secondary | ICD-10-CM | POA: Diagnosis not present

## 2020-08-01 DIAGNOSIS — Z978 Presence of other specified devices: Secondary | ICD-10-CM | POA: Diagnosis not present

## 2020-08-01 DIAGNOSIS — K8020 Calculus of gallbladder without cholecystitis without obstruction: Secondary | ICD-10-CM | POA: Diagnosis not present

## 2020-08-26 DIAGNOSIS — G4700 Insomnia, unspecified: Secondary | ICD-10-CM | POA: Diagnosis not present

## 2020-08-26 DIAGNOSIS — U071 COVID-19: Secondary | ICD-10-CM | POA: Diagnosis not present

## 2020-08-26 DIAGNOSIS — J1282 Pneumonia due to coronavirus disease 2019: Secondary | ICD-10-CM | POA: Diagnosis not present

## 2020-08-26 DIAGNOSIS — J449 Chronic obstructive pulmonary disease, unspecified: Secondary | ICD-10-CM | POA: Diagnosis not present

## 2020-08-26 DIAGNOSIS — R0602 Shortness of breath: Secondary | ICD-10-CM | POA: Diagnosis not present

## 2020-08-26 DIAGNOSIS — Z9221 Personal history of antineoplastic chemotherapy: Secondary | ICD-10-CM | POA: Diagnosis not present

## 2020-08-26 DIAGNOSIS — Z720 Tobacco use: Secondary | ICD-10-CM | POA: Diagnosis not present

## 2020-08-26 DIAGNOSIS — Z85048 Personal history of other malignant neoplasm of rectum, rectosigmoid junction, and anus: Secondary | ICD-10-CM | POA: Diagnosis not present

## 2020-08-26 DIAGNOSIS — Z880 Allergy status to penicillin: Secondary | ICD-10-CM | POA: Diagnosis not present

## 2020-08-26 DIAGNOSIS — E876 Hypokalemia: Secondary | ICD-10-CM | POA: Diagnosis not present

## 2020-08-26 DIAGNOSIS — F17200 Nicotine dependence, unspecified, uncomplicated: Secondary | ICD-10-CM | POA: Diagnosis not present

## 2020-08-26 DIAGNOSIS — J439 Emphysema, unspecified: Secondary | ICD-10-CM | POA: Diagnosis not present

## 2020-08-26 DIAGNOSIS — J9601 Acute respiratory failure with hypoxia: Secondary | ICD-10-CM | POA: Diagnosis not present

## 2020-08-26 DIAGNOSIS — R0902 Hypoxemia: Secondary | ICD-10-CM | POA: Diagnosis not present

## 2020-08-26 DIAGNOSIS — Z7951 Long term (current) use of inhaled steroids: Secondary | ICD-10-CM | POA: Diagnosis not present

## 2020-08-26 DIAGNOSIS — Z923 Personal history of irradiation: Secondary | ICD-10-CM | POA: Diagnosis not present

## 2020-08-26 DIAGNOSIS — J441 Chronic obstructive pulmonary disease with (acute) exacerbation: Secondary | ICD-10-CM | POA: Diagnosis not present

## 2020-08-26 DIAGNOSIS — R0600 Dyspnea, unspecified: Secondary | ICD-10-CM | POA: Diagnosis not present

## 2020-08-26 DIAGNOSIS — J9600 Acute respiratory failure, unspecified whether with hypoxia or hypercapnia: Secondary | ICD-10-CM | POA: Diagnosis not present

## 2020-09-27 DIAGNOSIS — U071 COVID-19: Secondary | ICD-10-CM | POA: Diagnosis not present

## 2020-10-01 DIAGNOSIS — U071 COVID-19: Secondary | ICD-10-CM | POA: Diagnosis not present

## 2020-10-01 DIAGNOSIS — R300 Dysuria: Secondary | ICD-10-CM | POA: Diagnosis not present

## 2020-10-01 DIAGNOSIS — R269 Unspecified abnormalities of gait and mobility: Secondary | ICD-10-CM | POA: Diagnosis not present

## 2020-10-01 DIAGNOSIS — Z6823 Body mass index (BMI) 23.0-23.9, adult: Secondary | ICD-10-CM | POA: Diagnosis not present

## 2020-10-01 DIAGNOSIS — Z7689 Persons encountering health services in other specified circumstances: Secondary | ICD-10-CM | POA: Diagnosis not present

## 2020-11-14 DIAGNOSIS — M6281 Muscle weakness (generalized): Secondary | ICD-10-CM | POA: Diagnosis not present

## 2020-11-14 DIAGNOSIS — R42 Dizziness and giddiness: Secondary | ICD-10-CM | POA: Diagnosis not present

## 2020-11-14 DIAGNOSIS — U099 Post covid-19 condition, unspecified: Secondary | ICD-10-CM | POA: Diagnosis not present

## 2020-11-14 DIAGNOSIS — M5416 Radiculopathy, lumbar region: Secondary | ICD-10-CM | POA: Diagnosis not present

## 2020-11-14 DIAGNOSIS — M792 Neuralgia and neuritis, unspecified: Secondary | ICD-10-CM | POA: Diagnosis not present

## 2020-11-14 DIAGNOSIS — R2689 Other abnormalities of gait and mobility: Secondary | ICD-10-CM | POA: Diagnosis not present

## 2020-11-22 DIAGNOSIS — M6281 Muscle weakness (generalized): Secondary | ICD-10-CM | POA: Diagnosis not present

## 2020-11-29 DIAGNOSIS — M419 Scoliosis, unspecified: Secondary | ICD-10-CM | POA: Diagnosis not present

## 2020-11-29 DIAGNOSIS — M792 Neuralgia and neuritis, unspecified: Secondary | ICD-10-CM | POA: Diagnosis not present

## 2020-11-29 DIAGNOSIS — M5416 Radiculopathy, lumbar region: Secondary | ICD-10-CM | POA: Diagnosis not present

## 2020-11-29 DIAGNOSIS — U099 Post covid-19 condition, unspecified: Secondary | ICD-10-CM | POA: Diagnosis not present

## 2020-11-29 DIAGNOSIS — R2689 Other abnormalities of gait and mobility: Secondary | ICD-10-CM | POA: Diagnosis not present

## 2020-12-18 DIAGNOSIS — R109 Unspecified abdominal pain: Secondary | ICD-10-CM | POA: Diagnosis not present

## 2020-12-18 DIAGNOSIS — K769 Liver disease, unspecified: Secondary | ICD-10-CM | POA: Diagnosis not present

## 2020-12-18 DIAGNOSIS — Z87891 Personal history of nicotine dependence: Secondary | ICD-10-CM | POA: Diagnosis not present

## 2020-12-18 DIAGNOSIS — J449 Chronic obstructive pulmonary disease, unspecified: Secondary | ICD-10-CM | POA: Diagnosis not present

## 2020-12-18 DIAGNOSIS — M4186 Other forms of scoliosis, lumbar region: Secondary | ICD-10-CM | POA: Diagnosis not present

## 2020-12-18 DIAGNOSIS — M47816 Spondylosis without myelopathy or radiculopathy, lumbar region: Secondary | ICD-10-CM | POA: Diagnosis not present

## 2020-12-18 DIAGNOSIS — Z6823 Body mass index (BMI) 23.0-23.9, adult: Secondary | ICD-10-CM | POA: Diagnosis not present

## 2020-12-18 DIAGNOSIS — K753 Granulomatous hepatitis, not elsewhere classified: Secondary | ICD-10-CM | POA: Diagnosis not present

## 2021-01-04 DIAGNOSIS — K9289 Other specified diseases of the digestive system: Secondary | ICD-10-CM | POA: Diagnosis not present

## 2021-01-04 DIAGNOSIS — R109 Unspecified abdominal pain: Secondary | ICD-10-CM | POA: Diagnosis not present

## 2021-01-05 DIAGNOSIS — R140 Abdominal distension (gaseous): Secondary | ICD-10-CM | POA: Diagnosis not present

## 2021-01-05 DIAGNOSIS — R109 Unspecified abdominal pain: Secondary | ICD-10-CM | POA: Diagnosis not present

## 2021-01-18 DIAGNOSIS — Z6823 Body mass index (BMI) 23.0-23.9, adult: Secondary | ICD-10-CM | POA: Diagnosis not present

## 2021-01-18 DIAGNOSIS — N390 Urinary tract infection, site not specified: Secondary | ICD-10-CM | POA: Diagnosis not present

## 2021-01-24 DIAGNOSIS — L659 Nonscarring hair loss, unspecified: Secondary | ICD-10-CM | POA: Diagnosis not present

## 2021-01-24 DIAGNOSIS — Z6823 Body mass index (BMI) 23.0-23.9, adult: Secondary | ICD-10-CM | POA: Diagnosis not present

## 2021-01-24 DIAGNOSIS — R300 Dysuria: Secondary | ICD-10-CM | POA: Diagnosis not present

## 2021-03-02 DIAGNOSIS — Z6824 Body mass index (BMI) 24.0-24.9, adult: Secondary | ICD-10-CM | POA: Diagnosis not present

## 2021-03-02 DIAGNOSIS — R300 Dysuria: Secondary | ICD-10-CM | POA: Diagnosis not present

## 2021-04-02 DIAGNOSIS — R300 Dysuria: Secondary | ICD-10-CM | POA: Diagnosis not present

## 2021-04-23 DIAGNOSIS — Z7689 Persons encountering health services in other specified circumstances: Secondary | ICD-10-CM | POA: Diagnosis not present

## 2021-04-23 DIAGNOSIS — R300 Dysuria: Secondary | ICD-10-CM | POA: Diagnosis not present

## 2021-04-23 DIAGNOSIS — N390 Urinary tract infection, site not specified: Secondary | ICD-10-CM | POA: Diagnosis not present

## 2021-05-16 ENCOUNTER — Ambulatory Visit (INDEPENDENT_AMBULATORY_CARE_PROVIDER_SITE_OTHER): Payer: Medicare Other | Admitting: Urology

## 2021-05-16 ENCOUNTER — Other Ambulatory Visit: Payer: Self-pay

## 2021-05-16 ENCOUNTER — Encounter: Payer: Self-pay | Admitting: Urology

## 2021-05-16 VITALS — BP 137/74 | HR 72

## 2021-05-16 DIAGNOSIS — R8290 Unspecified abnormal findings in urine: Secondary | ICD-10-CM

## 2021-05-16 DIAGNOSIS — N390 Urinary tract infection, site not specified: Secondary | ICD-10-CM

## 2021-05-16 DIAGNOSIS — R350 Frequency of micturition: Secondary | ICD-10-CM

## 2021-05-16 LAB — URINALYSIS, ROUTINE W REFLEX MICROSCOPIC
Bilirubin, UA: NEGATIVE
Glucose, UA: NEGATIVE
Ketones, UA: NEGATIVE
Nitrite, UA: POSITIVE — AB
Specific Gravity, UA: 1.025 (ref 1.005–1.030)
Urobilinogen, Ur: 0.2 mg/dL (ref 0.2–1.0)
pH, UA: 5.5 (ref 5.0–7.5)

## 2021-05-16 LAB — BLADDER SCAN AMB NON-IMAGING: Scan Result: 0

## 2021-05-16 MED ORDER — SULFAMETHOXAZOLE-TRIMETHOPRIM 800-160 MG PO TABS: 1.0000 | ORAL_TABLET | Freq: Two times a day (BID) | ORAL | 0 refills | Status: AC

## 2021-05-16 NOTE — Progress Notes (Signed)
Urological Symptom Review  Patient is experiencing the following symptoms: Frequent urination Hard to postpone urination Burning/pain with urination Get up at night to urinate Stream starts and stops Trouble starting stream Urinary tract infection   Review of Systems  Gastrointestinal (upper)  : Indigestion/heartburn  Gastrointestinal (lower) : Diarrhea  Constitutional : Night Sweats Fatigue  Skin: Negative for skin symptoms  Eyes: Negative for eye symptoms  Ear/Nose/Throat : Negative for Ear/Nose/Throat symptoms  Hematologic/Lymphatic: Easy bruising  Cardiovascular : Leg swelling Chest pain  Respiratory : Cough  Endocrine: Negative for endocrine symptoms  Musculoskeletal: Back pain Joint pain  Neurological: Negative for neurological symptoms  Psychologic: Negative for psychiatric symptoms

## 2021-05-16 NOTE — Progress Notes (Signed)
Assessment: 1. Recurrent UTI   2. Abnormal urine findings   3. Urinary frequency     Plan: Urine culture sent today. Begin treatment with Bactrim DS x 5 days. Rx sent. Methods to reduce the risk of recurrent UTIs discussed with the patient including increased fluid intake, timed and double voiding, daily cranberry supplement, daily probiotics, vaginal hormone cream, and potential role for daily antibiotic suppression. Will contact her with results and additional recommendations. Recommend further evaluation with pelvic exam and cystoscopy once her current UTI has cleared. Return to office in 3-4 weeks for cystoscopy and pelvic exam.  Chief Complaint:  Chief Complaint  Patient presents with   Recurrent UTI     History of Present Illness:  Annette Chavez is a 65 y.o. year old female who is seen in consultation from [place], [place]  for evaluation of recurrent UTI's.  She reports a long history of UTIs which have increased in frequency within the past year.  She reports 3-4 UTIs within the past 6 months.  Typical symptoms include dysuria, vaginal itching, and suprapubic discomfort.  No gross hematuria, flank pain, fever, or chills.  She has not required hospitalization for any of her UTIs.  Her symptoms typically resolve with antibiotic therapy.  She was last treated for a UTI approximately 3 weeks ago.  She does have baseline urinary symptoms of frequency, urgency, and nocturia.  She does report occasional urge incontinence.  She also has vaginal burning consistently.  She reports UTI symptoms at her visit today.  Urine culture results: 9/22 Citrobacter 8/22 Klebsiella 6/22 Klebsiella 5/22 mixed flora  CT urogram from 5/22 showed a chronic calcification in the right adrenal gland, left chronic vascular calcification and left renal cyst, no other stones or evidence of hydronephrosis.  She does have fecal incontinence without awareness following treatment for rectal  cancer.  She is using incontinence pads on a regular basis.  The fecal incontinence has increased in severity within the past year.   Past Medical History:  Past Medical History:  Diagnosis Date   Anxiety    Asthma    History of rectal cancer    Insomnia    Low back pain     Past Surgical History:  Past Surgical History:  Procedure Laterality Date   BREAST SURGERY     CATARACT EXTRACTION Bilateral    CERVICAL FUSION     LEFT HEART CATH AND CORONARY ANGIOGRAPHY N/A 09/11/2017   Procedure: LEFT HEART CATH AND CORONARY ANGIOGRAPHY;  Surgeon: Jettie Booze, MD;  Location: [facility];  Service: Cardiovascular;  Laterality: N/A;   RECTAL SURGERY      Allergies:  Allergies  Allergen Reactions   Levaquin [Levofloxacin] Other (See Comments)    High blood pressure, convulsing    Rocephin [Ceftriaxone] Other (See Comments)    High blood pressure, convulsing   Penicillins Rash and Other (See Comments)    Has patient had a PCN reaction causing immediate rash, facial/tongue/throat swelling, SOB or lightheadedness with hypotension: Unknown Has patient had a PCN reaction causing severe rash involving mucus membranes or skin necrosis: Unknown Has patient had a PCN reaction that required hospitalization: Unknown Has patient had a PCN reaction occurring within the last 10 years: No If all of the above answers are "NO", then may proceed with Cephalosporin use.     Family History:  Family History  Problem Relation Age of Onset   Heart attack Father    Heart attack Mother  Social History:  Social History   Tobacco Use   Smoking status: Every Day    Packs/day: 1.00    Types: Cigarettes   Smokeless tobacco: Current  Substance Use Topics   Alcohol use: No    Alcohol/week: 0.0 standard drinks   Drug use: No    Review of symptoms:  Constitutional:  Negative for unexplained weight loss, night sweats, fever, chills ENT:  Negative for nose bleeds, sinus pain, painful  swallowing CV:  Negative for chest pain, shortness of breath, exercise intolerance, palpitations, loss of consciousness Resp:  Negative for cough, wheezing, shortness of breath GI:  Negative for nausea, vomiting, diarrhea, bloody stools GU:  Positives noted in HPI; otherwise negative for gross hematuria Neuro:  Negative for seizures, poor balance, limb weakness, slurred speech Psych:  Negative for lack of energy, depression, anxiety Endocrine:  Negative for polydipsia, polyuria, symptoms of hypoglycemia (dizziness, hunger, sweating) Hematologic:  Negative for anemia, purpura, petechia, prolonged or excessive bleeding, use of anticoagulants  Allergic:  Negative for difficulty breathing or choking as a result of exposure to anything; no shellfish allergy; no allergic response (rash/itch) to materials, foods  Physical exam: BP 137/74   Pulse 72  GENERAL APPEARANCE:  Well appearing, well developed, well nourished, NAD HEENT: Atraumatic, Normocephalic, oropharynx clear. NECK: Supple without lymphadenopathy or thyromegaly. LUNGS: Clear to auscultation bilaterally. HEART: Regular Rate and Rhythm without murmurs, gallops, or rubs. ABDOMEN: Soft, non-tender, No Masses. EXTREMITIES: Moves all extremities well.  Without clubbing, cyanosis, or edema. NEUROLOGIC:  Alert and oriented x 3, normal gait, CN II-XII grossly intact.  MENTAL STATUS:  Appropriate. BACK:  Non-tender to palpation.  No CVAT SKIN:  Warm, dry and intact.    Results: Results for orders placed or performed in visit on 05/16/21 (from the past 24 hour(s))  Urinalysis, Routine w reflex microscopic     Status: Abnormal   Collection Time: 05/16/21  1:44 PM  Result Value Ref Range   Specific Gravity, UA 1.025 1.005 - 1.030   pH, UA 5.5 5.0 - 7.5   Color, UA Amber (A) Yellow   Appearance Ur Cloudy (A) Clear   Leukocytes,UA 2+ (A) Negative   Protein,UA 1+ (A) Negative/Trace   Glucose, UA Negative Negative   Ketones, UA Negative  Negative   RBC, UA 1+ (A) Negative   Bilirubin, UA Negative Negative   Urobilinogen, Ur 0.2 0.2 - 1.0 mg/dL   Nitrite, UA Positive (A) Negative   Microscopic Examination Comment    Narrative   Performed at:  [facility] [address] [place], [place]  [id number] Lab Director: Mina Marble MT, Phone:  [phone]      Results for orders placed or performed in visit on 05/16/21 (from the past 24 hour(s))  BLADDER SCAN AMB NON-IMAGING   Collection Time: 05/16/21  1:50 PM  Result Value Ref Range   Scan Result 0

## 2021-05-20 LAB — URINE CULTURE

## 2021-05-21 ENCOUNTER — Telehealth: Payer: Self-pay

## 2021-05-21 MED ORDER — NITROFURANTOIN MONOHYD MACRO 100 MG PO CAPS: 100.0000 mg | ORAL_CAPSULE | Freq: Every day | ORAL | 2 refills | Status: DC

## 2021-05-21 NOTE — Telephone Encounter (Signed)
-----   Message from Primus Bravo, MD sent at 05/21/2021 10:44 AM EDT ----- Please notify the patient that her urine culture does show evidence of a UTI.  The antibiotic prescribed should treat this.  I have also sent in a prescription for daily Macrobid for her to begin once she completes the Bactrim.  This is to prevent another UTI.  She should continue this medicine until her follow-up visit.

## 2021-05-21 NOTE — Telephone Encounter (Signed)
Patient called and made aware.

## 2021-05-21 NOTE — Addendum Note (Signed)
Addended by: Primus Bravo on: 05/21/2021 10:44 AM   Modules accepted: Orders

## 2021-05-23 DIAGNOSIS — K219 Gastro-esophageal reflux disease without esophagitis: Secondary | ICD-10-CM | POA: Diagnosis not present

## 2021-05-23 DIAGNOSIS — Z1389 Encounter for screening for other disorder: Secondary | ICD-10-CM | POA: Diagnosis not present

## 2021-05-23 DIAGNOSIS — G4700 Insomnia, unspecified: Secondary | ICD-10-CM | POA: Diagnosis not present

## 2021-05-23 DIAGNOSIS — Z720 Tobacco use: Secondary | ICD-10-CM | POA: Diagnosis not present

## 2021-05-27 ENCOUNTER — Telehealth: Payer: Self-pay

## 2021-05-27 NOTE — Telephone Encounter (Signed)
Pt called and that  she was give a rx for macrobid 100mg  once a day, she said that it hasn't helped that the the pain has gotten worse she have pain pelvic area and having taking goody powder to help with pain. She said in the past when she has taken Macrobid that she taken this bid ,she wants to know if she needs to increase it help with the pain. Please advise

## 2021-05-27 NOTE — Telephone Encounter (Signed)
Called and spoke with patient , she wasn't sure if she was taken antibiotic  before going on Marocbid. Pt is going come by  05/28/21  for urine drop off and possible ua culture.

## 2021-05-28 ENCOUNTER — Other Ambulatory Visit: Payer: PRIVATE HEALTH INSURANCE

## 2021-05-28 ENCOUNTER — Other Ambulatory Visit: Payer: Self-pay

## 2021-05-28 ENCOUNTER — Encounter: Payer: Self-pay | Admitting: Internal Medicine

## 2021-05-28 DIAGNOSIS — N390 Urinary tract infection, site not specified: Secondary | ICD-10-CM | POA: Diagnosis not present

## 2021-05-28 LAB — URINALYSIS, ROUTINE W REFLEX MICROSCOPIC
Bilirubin, UA: NEGATIVE
Glucose, UA: NEGATIVE
Ketones, UA: NEGATIVE
Leukocytes,UA: NEGATIVE
Nitrite, UA: NEGATIVE
Protein,UA: NEGATIVE
RBC, UA: NEGATIVE
Specific Gravity, UA: 1.025 (ref 1.005–1.030)
Urobilinogen, Ur: 0.2 mg/dL (ref 0.2–1.0)
pH, UA: 5.5 (ref 5.0–7.5)

## 2021-05-30 ENCOUNTER — Telehealth: Payer: Self-pay

## 2021-05-30 LAB — URINE CULTURE: Organism ID, Bacteria: NO GROWTH

## 2021-05-30 NOTE — Telephone Encounter (Signed)
-----   Message from Primus Bravo, MD sent at 05/29/2021 12:31 PM EDT ----- Please let patient know that her urinalysis is not suspicious for a UTI.  A urine culture is pending and we will let her know when this result is available.  Recommend continuing daily Macrobid at this time.

## 2021-05-30 NOTE — Telephone Encounter (Signed)
Patient called and made aware.

## 2021-05-30 NOTE — Telephone Encounter (Signed)
-----   Message from Primus Bravo, MD sent at 05/30/2021  5:14 PM EDT ----- Please notify patient that her urine culture showed no evidence of UTI.  Recommend continuing daily Macrobid.

## 2021-06-12 NOTE — Progress Notes (Signed)
Assessment: 1. Recurrent UTI   2. Urinary frequency     Plan: Continue methods to reduce the risk of recurrent UTIs  Continue daily Macrobid  Trial of Myrbetriq 25 mg daily x2 weeks then increase to 50 mg daily.  Samples given.  Use and side effects discussed. Return to office in 1 month  Chief Complaint:  Chief Complaint  Patient presents with   Recurrent UTI     History of Present Illness:  Annette Chavez is a 65 y.o. year old female who is seen for further evaluation of recurrent UTI's.  She reports a long history of UTIs which have increased in frequency within the past year.  She reports 3-4 UTIs within the past 6 months.  Typical symptoms include dysuria, vaginal itching, and suprapubic discomfort.  No gross hematuria, flank pain, fever, or chills.  She has not required hospitalization for any of her UTIs.  Her symptoms typically resolve with antibiotic therapy.  She was last treated for a UTI approximately 3 weeks ago.  She does have baseline urinary symptoms of frequency, urgency, and nocturia.  She does report occasional urge incontinence.  She also has vaginal burning consistently.  She reports UTI symptoms at her visit today.  Urine culture results: 11/22 No growth 10/22 E. coli 9/22 Citrobacter 8/22 Klebsiella 6/22 Klebsiella 5/22 mixed flora  CT urogram from 5/22 showed a chronic calcification in the right adrenal gland, left chronic vascular calcification and left renal cyst, no other stones or evidence of hydronephrosis.  She does have fecal incontinence without awareness following treatment for rectal cancer.  She is using incontinence pads on a regular basis.  The fecal incontinence has increased in severity within the past year.  She was treated with Bactrim for her UTI on 05/20/2021.  She was then started on daily Macrobid. She presents today for further evaluation with cystoscopy and pelvic exam. She continues on daily Macrobid.  She still has some  urinary frequency, urgency, and nocturia.  No UTI symptoms today other than mild dysuria.  Portions of the above documentation were copied from a prior visit for review purposes only.  Past Medical History:  Past Medical History:  Diagnosis Date   Anxiety    Asthma    History of rectal cancer    Insomnia    Low back pain     Past Surgical History:  Past Surgical History:  Procedure Laterality Date   BREAST SURGERY     CATARACT EXTRACTION Bilateral    CERVICAL FUSION     LEFT HEART CATH AND CORONARY ANGIOGRAPHY N/A 09/11/2017   Procedure: LEFT HEART CATH AND CORONARY ANGIOGRAPHY;  Surgeon: Jettie Booze, MD;  Location: [facility];  Service: Cardiovascular;  Laterality: N/A;   RECTAL SURGERY      Allergies:  Allergies  Allergen Reactions   Levaquin [Levofloxacin] Other (See Comments)    High blood pressure, convulsing    Rocephin [Ceftriaxone] Other (See Comments)    High blood pressure, convulsing   Penicillins Rash and Other (See Comments)    Has patient had a PCN reaction causing immediate rash, facial/tongue/throat swelling, SOB or lightheadedness with hypotension: Unknown Has patient had a PCN reaction causing severe rash involving mucus membranes or skin necrosis: Unknown Has patient had a PCN reaction that required hospitalization: Unknown Has patient had a PCN reaction occurring within the last 10 years: No If all of the above answers are "NO", then may proceed with Cephalosporin use.  Family History:  Family History  Problem Relation Age of Onset   Heart attack Father    Heart attack Mother     Social History:  Social History   Tobacco Use   Smoking status: Every Day    Packs/day: 1.00    Types: Cigarettes   Smokeless tobacco: Current  Substance Use Topics   Alcohol use: No    Alcohol/week: 0.0 standard drinks   Drug use: No    ROS: Constitutional:  Negative for fever, chills, weight loss CV: Negative for chest pain, previous MI,  hypertension Respiratory:  Negative for shortness of breath, wheezing, sleep apnea, frequent cough GI:  Negative for nausea, vomiting, bloody stool, GERD  Physical exam: BP (!) 151/72   Pulse 82   Temp 98 F (36.7 C)  GENERAL APPEARANCE:  Well appearing, well developed, well nourished, NAD HEENT:  Atraumatic, normocephalic, oropharynx clear NECK:  Supple without lymphadenopathy or thyromegaly ABDOMEN:  Soft, non-tender, no masses EXTREMITIES:  Moves all extremities well, without clubbing, cyanosis, or edema NEUROLOGIC:  Alert and oriented x 3, normal gait, CN II-XII grossly intact MENTAL STATUS:  appropriate BACK:  Non-tender to palpation, No CVAT SKIN:  Warm, dry, and intact GU: Urethra: no masses or tenderness, no leakage with cough or valsalva Vagina: grade 2 cystocele, normal mucosa  Results: U/A:  0-5 WBC, 0-2 RBC, 0-10 epithelial cells, few bacteria   Procedure:  Flexible Cystourethroscopy  Pre-operative Diagnosis: UTI  Post-operative Diagnosis: UTI  Anesthesia:  local with lidocaine jelly  Surgical Narrative:  After appropriate informed consent was obtained, the patient was prepped and draped in the usual sterile fashion in the supine position.  He was correctly identified and the proper procedure delineated prior to proceeding.  Sterile lidocaine gel was instilled in the urethra. The flexible cystoscope was introduced without difficulty.  Findings:  Anterior urethra: Normal  Bladder: Normal  Ureteral orifices: normal  Additional findings:  Saline bladder wash for cytology was not performed.    The cystoscope was then removed.  The patient tolerated the procedure well.

## 2021-06-13 ENCOUNTER — Encounter: Payer: Self-pay | Admitting: Urology

## 2021-06-13 ENCOUNTER — Ambulatory Visit (INDEPENDENT_AMBULATORY_CARE_PROVIDER_SITE_OTHER): Payer: Medicare Other | Admitting: Urology

## 2021-06-13 ENCOUNTER — Other Ambulatory Visit: Payer: Self-pay

## 2021-06-13 VITALS — BP 151/72 | HR 82 | Temp 98.0°F

## 2021-06-13 DIAGNOSIS — R350 Frequency of micturition: Secondary | ICD-10-CM | POA: Diagnosis not present

## 2021-06-13 DIAGNOSIS — N390 Urinary tract infection, site not specified: Secondary | ICD-10-CM | POA: Diagnosis not present

## 2021-06-13 MED ORDER — MIRABEGRON ER 50 MG PO TB24: 50.0000 mg | ORAL_TABLET | Freq: Every day | ORAL | 0 refills | Status: AC

## 2021-06-13 NOTE — Progress Notes (Signed)
Urological Symptom Review  Patient is experiencing the following symptoms: Frequent urination Burning/pain with urination Get up at night to urinate Urinary tract infection   Review of Systems  Gastrointestinal (upper)  : Negative for upper GI symptoms  Gastrointestinal (lower) : Diarrhea  Constitutional : Fever Fatigue  Skin: Negative for skin symptoms  Eyes: Blurred vision  Ear/Nose/Throat : Negative for Ear/Nose/Throat symptoms  Hematologic/Lymphatic: Easy bruising  Cardiovascular : Leg swelling  Respiratory : Cough  Endocrine: Excessive thirst  Musculoskeletal: Back pain Joint pain  Neurological: Negative for neurological symptoms  Psychologic: Negative for psychiatric symptoms

## 2021-06-14 DIAGNOSIS — N390 Urinary tract infection, site not specified: Secondary | ICD-10-CM | POA: Diagnosis not present

## 2021-06-14 LAB — MICROSCOPIC EXAMINATION: Renal Epithel, UA: NONE SEEN /hpf

## 2021-06-14 LAB — URINALYSIS, ROUTINE W REFLEX MICROSCOPIC
Bilirubin, UA: NEGATIVE
Glucose, UA: NEGATIVE
Ketones, UA: NEGATIVE
Leukocytes,UA: NEGATIVE
Nitrite, UA: NEGATIVE
Protein,UA: NEGATIVE
Specific Gravity, UA: 1.025 (ref 1.005–1.030)
Urobilinogen, Ur: 0.2 mg/dL (ref 0.2–1.0)
pH, UA: 5.5 (ref 5.0–7.5)

## 2021-06-26 ENCOUNTER — Other Ambulatory Visit: Payer: PRIVATE HEALTH INSURANCE

## 2021-06-26 DIAGNOSIS — J441 Chronic obstructive pulmonary disease with (acute) exacerbation: Secondary | ICD-10-CM | POA: Diagnosis not present

## 2021-07-03 DIAGNOSIS — J441 Chronic obstructive pulmonary disease with (acute) exacerbation: Secondary | ICD-10-CM | POA: Diagnosis not present

## 2021-07-03 DIAGNOSIS — R059 Cough, unspecified: Secondary | ICD-10-CM | POA: Diagnosis not present

## 2021-07-04 ENCOUNTER — Ambulatory Visit: Payer: PRIVATE HEALTH INSURANCE | Admitting: Internal Medicine

## 2021-07-09 ENCOUNTER — Ambulatory Visit: Payer: PRIVATE HEALTH INSURANCE | Admitting: Urology

## 2021-07-16 DIAGNOSIS — J441 Chronic obstructive pulmonary disease with (acute) exacerbation: Secondary | ICD-10-CM | POA: Diagnosis not present

## 2021-07-24 ENCOUNTER — Ambulatory Visit: Payer: PRIVATE HEALTH INSURANCE | Admitting: Urology

## 2021-08-06 ENCOUNTER — Ambulatory Visit: Payer: PRIVATE HEALTH INSURANCE | Admitting: Urology

## 2021-08-08 ENCOUNTER — Encounter: Payer: Self-pay | Admitting: Internal Medicine

## 2021-08-08 ENCOUNTER — Encounter: Payer: Self-pay | Admitting: *Deleted

## 2021-08-08 ENCOUNTER — Other Ambulatory Visit: Payer: Self-pay

## 2021-08-08 ENCOUNTER — Telehealth: Payer: Self-pay | Admitting: *Deleted

## 2021-08-08 ENCOUNTER — Ambulatory Visit: Payer: Medicare Other | Admitting: Internal Medicine

## 2021-08-08 DIAGNOSIS — R208 Other disturbances of skin sensation: Secondary | ICD-10-CM | POA: Diagnosis not present

## 2021-08-08 DIAGNOSIS — R197 Diarrhea, unspecified: Secondary | ICD-10-CM | POA: Insufficient documentation

## 2021-08-08 DIAGNOSIS — C20 Malignant neoplasm of rectum: Secondary | ICD-10-CM | POA: Diagnosis not present

## 2021-08-08 NOTE — Telephone Encounter (Signed)
PA approved via [facility]. Auth# [id number], DOS: Aug 23, 2021 - Nov 21, 2021

## 2021-08-08 NOTE — Patient Instructions (Signed)
We will schedule you for colonoscopy for surveillance purposes in regards to your history of rectal cancer as well as to evaluate your diarrhea and rectal discomfort.  I am also going to request records from your previous cancer doctor and colonoscopy reports.  Further recommendations to follow.  It was very nice meeting you today.  Dr. Abbey Chatters  At [facility] we value your feedback. You may receive a survey about your visit today. Please share your experience as we strive to create trusting relationships with our patients to provide genuine, compassionate, quality care.  We appreciate your understanding and patience as we review any laboratory studies, imaging, and other diagnostic tests that are ordered as we care for you. Our office policy is 5 business days for review of these results, and any emergent or urgent results are addressed in a timely manner for your best interest. If you do not hear from our office in 1 week, please contact us.   We also encourage the use of MyChart, which contains your medical information for your review as well. If you are not enrolled in this feature, an access code is on this after visit summary for your convenience. Thank you for allowing [place] to be involved in your care.  It was great to see you today!  I hope you have a great rest of your Winter!    Elon Alas. Abbey Chatters, D.O. Gastroenterology and Hepatology [facility]

## 2021-08-08 NOTE — Progress Notes (Signed)
Primary Care Physician:  Rosine Door Primary Gastroenterologist:  Dr. Abbey Chatters  Chief Complaint  Patient presents with   rectal leakage    Had rectal cancer 2012. Has done cologuard past few years-negative. Unable to recall last tcs. Last tcs was done in [place]. Pt states he hurt her and she wasn't going back   Gas   Abdominal Pain    Lower abd    HPI:   Annette Chavez is a 66 y.o. female who presents to the clinic today by referral from her PCP Tawni Carnes for evaluation.  She has a history of rectal squamous cell carcinoma diagnosed in 2012.  She states she underwent chemotherapy and radiation in the area and was told that her cancer has been cured.  I do not have access to any of her oncology or previous colonoscopy reports.  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  I have biopsy reports from follow-up colonoscopy on 08/04/2011 which showed benign colonic epithelium as well as acute inflamed and necrotic soft tissue reactive soft tissue and fragments of benign: No tumor identified.  She has not had a colonoscopy since that time.  She states she was in so much pain after her last colonoscopy that she did not ever want to have another one.  Does notice having Cologuard testing a few years ago which she states was negative.  Today, her chief complaint for me is chronic diarrhea.  States she will have up to 10 loose bowel movements every morning.  Also notes fecal incontinence and leakage.  This problem has significantly affected her quality of life.  No melena hematochezia.  No mucus in her stool.  Also with rectal burning which she describes as irritation from having bowel movements so frequently.  Does occasionally take Imodium which stopped the diarrhea but then leads to constipation.  Past Medical History:  Diagnosis Date   Anxiety    Asthma    History of rectal  cancer    Insomnia    Low back pain     Past Surgical History:  Procedure Laterality Date   BREAST SURGERY     CATARACT EXTRACTION Bilateral    CERVICAL FUSION     LEFT HEART CATH AND CORONARY ANGIOGRAPHY N/A 09/11/2017   Procedure: LEFT HEART CATH AND CORONARY ANGIOGRAPHY;  Surgeon: Jettie Booze, MD;  Location: [facility];  Service: Cardiovascular;  Laterality: N/A;   RECTAL SURGERY      Current Outpatient Medications  Medication Sig Dispense Refill   acetaminophen (TYLENOL) 500 MG tablet Take 500-1,000 mg by mouth every 6 (six) hours as needed for mild pain or headache.      albuterol (PROVENTIL HFA;VENTOLIN HFA) 108 (90 BASE) MCG/ACT inhaler Inhale 2 puffs into the lungs every 6 (six) hours as needed for wheezing or shortness of breath.     ALPRAZolam (XANAX) 1 MG tablet Take 1 mg by mouth 3 (three) times daily as needed for anxiety.      aspirin EC 81 MG tablet Take 1 tablet (81 mg total) by mouth daily. 30 tablet 3   EPINEPHrine 0.3 mg/0.3 mL IJ SOAJ injection Inject 0.3 mLs (0.3 mg total) into the muscle once. For severe allergic reaction 1 Device 1   fluticasone-salmeterol (ADVAIR HFA) 115-21 MCG/ACT inhaler Inhale 2 puffs into the lungs 2 (two) times daily.      ibuprofen (ADVIL,MOTRIN) 800 MG tablet  Take 800 mg by mouth every 8 (eight) hours as needed for headache or moderate pain.     ipratropium-albuterol (DUONEB) 0.5-2.5 (3) MG/3ML SOLN Take 3 mLs by nebulization every 6 (six) hours as needed (for shortness of breath or wheezing).      mirabegron ER (MYRBETRIQ) 50 MG TB24 tablet Take 1 tablet (50 mg total) by mouth daily. 28 tablet 0   mometasone (NASONEX) 50 MCG/ACT nasal spray Place 2 sprays into the nose daily as needed (for allergies).      nitrofurantoin, macrocrystal-monohydrate, (MACROBID) 100 MG capsule Take 1 capsule (100 mg total) by mouth daily. 30 capsule 2   NON FORMULARY CBD cream as needed.     omeprazole (PRILOSEC) 20 MG capsule Take 20 mg by  mouth daily as needed (acid reflux).     OVER THE COUNTER MEDICATION Apply 1 application topically daily as needed (for muscle pain). [place] Gel     No current facility-administered medications for this visit.    Allergies as of 08/08/2021 - Review Complete 08/08/2021  Allergen Reaction Noted   Levaquin [levofloxacin] Other (See Comments) 12/06/2014   Rocephin [ceftriaxone] Other (See Comments) 12/06/2014   Penicillins Rash and Other (See Comments) 12/06/2014    Family History  Problem Relation Age of Onset   Heart attack Father    Heart attack Mother     Social History   Socioeconomic History   Marital status: Married    Spouse name: Not on file   Number of children: Not on file   Years of education: Not on file   Highest education level: Not on file  Occupational History   Not on file  Tobacco Use   Smoking status: Former    Packs/day: 1.00    Types: Cigarettes   Smokeless tobacco: Never  Substance and Sexual Activity   Alcohol use: No    Alcohol/week: 0.0 standard drinks   Drug use: No   Sexual activity: Not on file  Other Topics Concern   Not on file  Social History Narrative   Not on file   Social Determinants of Health   Financial Resource Strain: Not on file  Food Insecurity: Not on file  Transportation Needs: Not on file  Physical Activity: Not on file  Stress: Not on file  Social Connections: Not on file  Intimate Partner Violence: Not on file    Subjective: Review of Systems  Constitutional:  Negative for chills and fever.  HENT:  Negative for congestion and hearing loss.   Eyes:  Negative for blurred vision and double vision.  Respiratory:  Negative for cough and shortness of breath.   Cardiovascular:  Negative for chest pain and palpitations.  Gastrointestinal:  Positive for diarrhea. Negative for abdominal pain, blood in stool, constipation, heartburn, melena and vomiting.       Rectal burning  Genitourinary:  Negative for dysuria and  urgency.  Musculoskeletal:  Negative for joint pain and myalgias.  Skin:  Negative for itching and rash.  Neurological:  Negative for dizziness and headaches.  Psychiatric/Behavioral:  Negative for depression. The patient is not nervous/anxious.       Objective: BP 133/74    Pulse 90    Temp (!) 96.8 F (36 C) (Temporal)    Ht 5\' 3"  (1.6 m)    Wt 145 lb 12.8 oz (66.1 kg)    BMI 25.83 kg/m  Physical Exam Constitutional:      Appearance: Normal appearance.  HENT:     Head: Normocephalic and  atraumatic.  Eyes:     Extraocular Movements: Extraocular movements intact.     Conjunctiva/sclera: Conjunctivae normal.  Cardiovascular:     Rate and Rhythm: Normal rate and regular rhythm.  Pulmonary:     Effort: Pulmonary effort is normal.     Breath sounds: Normal breath sounds.  Abdominal:     General: Bowel sounds are normal.     Palpations: Abdomen is soft.  Musculoskeletal:        General: No swelling. Normal range of motion.     Cervical back: Normal range of motion and neck supple.  Skin:    General: Skin is warm and dry.     Coloration: Skin is not jaundiced.  Neurological:     General: No focal deficit present.     Mental Status: She is alert and oriented to person, place, and time.  Psychiatric:        Mood and Affect: Mood normal.        Behavior: Behavior normal.   Patient deferred rectal exam until time of colonoscopy  Assessment: *Rectal squamous cell carcinoma status post chemo and radiation *Chronic diarrhea *Rectal burning  Plan: Discussed patient's rectal cancer in depth with her today.  We will request records from her oncologist as well as her previous colonoscopy reports.  She is due for colonoscopy for surveillance purposes.  She is hesitant to have this done as she had a bad experience prior.  After consideration she has agreed to have this done.  We will schedule today.  We will use MiraLAX prep.  Etiology of her chronic diarrhea unclear.  Possibly  sequela from previous radiation treatments to her rectum.  I will also perform random colon biopsies to rule out conditions such as microscopic colitis.  In the meantime continue Imodium as needed.  Thank you Tawni Carnes for the kind referral.  08/08/2021 3:48 PM   Disclaimer: This note was dictated with voice recognition software. Similar sounding words can inadvertently be transcribed and may not be corrected upon review.

## 2021-08-15 ENCOUNTER — Ambulatory Visit: Payer: PRIVATE HEALTH INSURANCE | Admitting: Urology

## 2021-08-15 DIAGNOSIS — N390 Urinary tract infection, site not specified: Secondary | ICD-10-CM

## 2021-08-15 DIAGNOSIS — R350 Frequency of micturition: Secondary | ICD-10-CM

## 2021-08-15 NOTE — Progress Notes (Deleted)
Assessment: 1. Urinary frequency   2. Recurrent UTI     Plan: Continue methods to reduce the risk of recurrent UTIs  Continue daily Macrobid  Trial of Myrbetriq 25 mg daily x2 weeks then increase to 50 mg daily.  Samples given.  Use and side effects discussed. Return to office in 1 month  Chief Complaint:  No chief complaint on file.   History of Present Illness:  Annette Chavez is a 66 y.o. year old female who is seen for further evaluation of recurrent UTI's.  She reports a long history of UTIs which have increased in frequency within the past year.  She reports 3-4 UTIs within the past 6 months.  Typical symptoms include dysuria, vaginal itching, and suprapubic discomfort.  No gross hematuria, flank pain, fever, or chills.  She has not required hospitalization for any of her UTIs.  Her symptoms typically resolve with antibiotic therapy.  She was last treated for a UTI approximately 3 weeks ago.  She does have baseline urinary symptoms of frequency, urgency, and nocturia.  She does report occasional urge incontinence.  She also has vaginal burning consistently.  She reports UTI symptoms at her visit today.  Urine culture results: 11/22 No growth 10/22 E. coli 9/22 Citrobacter 8/22 Klebsiella 6/22 Klebsiella 5/22 mixed flora  CT urogram from 5/22 showed a chronic calcification in the right adrenal gland, left chronic vascular calcification and left renal cyst, no other stones or evidence of hydronephrosis.  She does have fecal incontinence without awareness following treatment for rectal cancer.  She is using incontinence pads on a regular basis.  The fecal incontinence has increased in severity within the past year.  She was treated with Bactrim for her UTI on 05/20/2021.  She was then started on daily Macrobid. Cystoscopy from 06/13/2021 demonstrated no urethral or bladder abnormalities. She was given a trial of Myrbetriq at her last visit. She continues on daily  Macrobid.  She still has some urinary frequency, urgency, and nocturia.  No UTI symptoms today other than mild dysuria.  Portions of the above documentation were copied from a prior visit for review purposes only.  Past Medical History:  Past Medical History:  Diagnosis Date   Anxiety    Asthma    History of rectal cancer    Insomnia    Low back pain     Past Surgical History:  Past Surgical History:  Procedure Laterality Date   BREAST SURGERY     CATARACT EXTRACTION Bilateral    CERVICAL FUSION     LEFT HEART CATH AND CORONARY ANGIOGRAPHY N/A 09/11/2017   Procedure: LEFT HEART CATH AND CORONARY ANGIOGRAPHY;  Surgeon: Jettie Booze, MD;  Location: [facility];  Service: Cardiovascular;  Laterality: N/A;   RECTAL SURGERY      Allergies:  Allergies  Allergen Reactions   Levaquin [Levofloxacin] Other (See Comments)    High blood pressure, convulsing    Rocephin [Ceftriaxone] Other (See Comments)    High blood pressure, convulsing   Penicillins Rash and Other (See Comments)    Has patient had a PCN reaction causing immediate rash, facial/tongue/throat swelling, SOB or lightheadedness with hypotension: Unknown Has patient had a PCN reaction causing severe rash involving mucus membranes or skin necrosis: Unknown Has patient had a PCN reaction that required hospitalization: Unknown Has patient had a PCN reaction occurring within the last 10 years: No If all of the above answers are "NO", then may proceed with Cephalosporin use.  Family History:  Family History  Problem Relation Age of Onset   Heart attack Father    Heart attack Mother     Social History:  Social History   Tobacco Use   Smoking status: Former    Packs/day: 1.00    Types: Cigarettes   Smokeless tobacco: Never  Substance Use Topics   Alcohol use: No    Alcohol/week: 0.0 standard drinks   Drug use: No   ROS: Constitutional:  Negative for fever, chills, weight loss CV: Negative for  chest pain, previous MI, hypertension Respiratory:  Negative for shortness of breath, wheezing, sleep apnea, frequent cough GI:  Negative for nausea, vomiting, bloody stool, GERD  Physical exam: There were no vitals taken for this visit. ***  Results: ***

## 2021-08-17 DIAGNOSIS — J441 Chronic obstructive pulmonary disease with (acute) exacerbation: Secondary | ICD-10-CM | POA: Diagnosis not present

## 2021-08-17 DIAGNOSIS — J069 Acute upper respiratory infection, unspecified: Secondary | ICD-10-CM | POA: Diagnosis not present

## 2021-08-19 DIAGNOSIS — J441 Chronic obstructive pulmonary disease with (acute) exacerbation: Secondary | ICD-10-CM | POA: Diagnosis not present

## 2021-08-20 ENCOUNTER — Telehealth: Payer: Self-pay | Admitting: Internal Medicine

## 2021-08-20 NOTE — Telephone Encounter (Signed)
Called pt. She is currently sick and needs to reschedule procedure for Friday 1/26 with Dr. Abbey Chatters. She has been rescheduled to 2/24 at 7:30am. Aware will inform endo and will mail new prep instructions.

## 2021-08-20 NOTE — Telephone Encounter (Signed)
Please call patient to reschedule her procedure, she is sick with congestion and a cough [phone]

## 2021-09-20 ENCOUNTER — Encounter (HOSPITAL_COMMUNITY): Payer: Self-pay

## 2021-09-20 ENCOUNTER — Ambulatory Visit (HOSPITAL_COMMUNITY)
Admission: RE | Admit: 2021-09-20 | Discharge: 2021-09-20 | Disposition: A | Discharge status: Home / Self Care | Payer: Medicare Other | Attending: Internal Medicine | Admitting: Internal Medicine

## 2021-09-20 ENCOUNTER — Other Ambulatory Visit: Payer: Self-pay

## 2021-09-20 ENCOUNTER — Encounter (HOSPITAL_COMMUNITY)
Admission: RE | Disposition: A | Discharge status: Home / Self Care | Payer: Self-pay | Source: Home / Self Care | Attending: Internal Medicine

## 2021-09-20 ENCOUNTER — Ambulatory Visit (HOSPITAL_BASED_OUTPATIENT_CLINIC_OR_DEPARTMENT_OTHER): Payer: Medicare Other | Admitting: Anesthesiology

## 2021-09-20 ENCOUNTER — Ambulatory Visit (HOSPITAL_COMMUNITY): Payer: Medicare Other | Admitting: Anesthesiology

## 2021-09-20 DIAGNOSIS — R197 Diarrhea, unspecified: Secondary | ICD-10-CM | POA: Diagnosis not present

## 2021-09-20 DIAGNOSIS — F419 Anxiety disorder, unspecified: Secondary | ICD-10-CM

## 2021-09-20 DIAGNOSIS — K5730 Diverticulosis of large intestine without perforation or abscess without bleeding: Secondary | ICD-10-CM

## 2021-09-20 DIAGNOSIS — K219 Gastro-esophageal reflux disease without esophagitis: Secondary | ICD-10-CM | POA: Insufficient documentation

## 2021-09-20 DIAGNOSIS — K529 Noninfective gastroenteritis and colitis, unspecified: Secondary | ICD-10-CM

## 2021-09-20 DIAGNOSIS — Z87891 Personal history of nicotine dependence: Secondary | ICD-10-CM | POA: Diagnosis not present

## 2021-09-20 DIAGNOSIS — K6289 Other specified diseases of anus and rectum: Secondary | ICD-10-CM | POA: Diagnosis not present

## 2021-09-20 DIAGNOSIS — J449 Chronic obstructive pulmonary disease, unspecified: Secondary | ICD-10-CM | POA: Diagnosis not present

## 2021-09-20 DIAGNOSIS — Z85048 Personal history of other malignant neoplasm of rectum, rectosigmoid junction, and anus: Secondary | ICD-10-CM | POA: Insufficient documentation

## 2021-09-20 DIAGNOSIS — K648 Other hemorrhoids: Secondary | ICD-10-CM | POA: Diagnosis not present

## 2021-09-20 HISTORY — PX: BIOPSY: SHX5522

## 2021-09-20 HISTORY — PX: COLONOSCOPY WITH PROPOFOL: SHX5780

## 2021-09-20 SURGERY — COLONOSCOPY WITH PROPOFOL
Anesthesia: General

## 2021-09-20 MED ORDER — LIDOCAINE HCL (CARDIAC) PF 100 MG/5ML IV SOSY
PREFILLED_SYRINGE | INTRAVENOUS | Status: DC | PRN
Start: 2021-09-20 — End: 2021-09-20
  Administered 2021-09-20: 50 mg via INTRAVENOUS

## 2021-09-20 MED ORDER — PROPOFOL 10 MG/ML IV BOLUS
INTRAVENOUS | Status: DC | PRN
  Administered 2021-09-20: 90 mg via INTRAVENOUS

## 2021-09-20 MED ORDER — LACTATED RINGERS IV SOLN: INTRAVENOUS | Status: DC

## 2021-09-20 MED ORDER — PROPOFOL 500 MG/50ML IV EMUL
INTRAVENOUS | Status: DC | PRN
  Administered 2021-09-20: 125 ug/kg/min via INTRAVENOUS

## 2021-09-20 NOTE — Transfer of Care (Addendum)
Immediate Anesthesia Transfer of Care Note  Patient: Annette Chavez  Procedure(s) Performed: COLONOSCOPY WITH PROPOFOL BIOPSY  Patient Location: Short Stay  Anesthesia Type:General  Level of Consciousness: drowsy  Airway & Oxygen Therapy: Patient Spontanous Breathing  Post-op Assessment: Report given to RN and Post -op Vital signs reviewed and stable  Post vital signs: Reviewed and stable  Last Vitals:  Vitals Value Taken Time  BP    Temp    Pulse    Resp    SpO2      Last Pain:  Vitals:   09/20/21 0744  TempSrc:   PainSc: 0-No pain      Patients Stated Pain Goal: 10 ([id number])  Complications: No notable events documented.

## 2021-09-20 NOTE — Discharge Instructions (Addendum)
°  Colonoscopy Discharge Instructions  Read the instructions outlined below and refer to this sheet in the next few weeks. These discharge instructions provide you with general information on caring for yourself after you leave the hospital. Your doctor may also give you specific instructions. While your treatment has been planned according to the most current medical practices available, unavoidable complications occasionally occur.   ACTIVITY You may resume your regular activity, but move at a slower pace for the next 24 hours.  Take frequent rest periods for the next 24 hours.  Walking will help get rid of the air and reduce the bloated feeling in your belly (abdomen).  No driving for 24 hours (because of the medicine (anesthesia) used during the test).   Do not sign any important legal documents or operate any machinery for 24 hours (because of the anesthesia used during the test).  NUTRITION Drink plenty of fluids.  You may resume your normal diet as instructed by your doctor.  Begin with a light meal and progress to your normal diet. Heavy or fried foods are harder to digest and may make you feel sick to your stomach (nauseated).  Avoid alcoholic beverages for 24 hours or as instructed.  MEDICATIONS You may resume your normal medications unless your doctor tells you otherwise.  WHAT YOU CAN EXPECT TODAY Some feelings of bloating in the abdomen.  Passage of more gas than usual.  Spotting of blood in your stool or on the toilet paper.  IF YOU HAD POLYPS REMOVED DURING THE COLONOSCOPY: No aspirin products for 7 days or as instructed.  No alcohol for 7 days or as instructed.  Eat a soft diet for the next 24 hours.  FINDING OUT THE RESULTS OF YOUR TEST Not all test results are available during your visit. If your test results are not back during the visit, make an appointment with your caregiver to find out the results. Do not assume everything is normal if you have not heard from your  caregiver or the medical facility. It is important for you to follow up on all of your test results.  SEEK IMMEDIATE MEDICAL ATTENTION IF: You have more than a spotting of blood in your stool.  Your belly is swollen (abdominal distention).  You are nauseated or vomiting.  You have a temperature over 101.  You have abdominal pain or discomfort that is severe or gets worse throughout the day.   Your colonoscopy was relatively unremarkable.  I did not find any polyps or evidence of colon cancer.  I recommend repeating colonoscopy in 5 years given her history of rectal cancer.  You do have diverticulosis and internal hemorrhoids. I would recommend increasing fiber in your diet or adding OTC Benefiber/Metamucil. Be sure to drink at least 4 to 6 glasses of water daily.   I do not see any residual rectal cancer.  I did take biopsies of your rectum as well as throughout your colon to evaluate your diarrhea.  Await pathology results, my office will contact you next week.  Follow-up with GI in 3 to 4 months.  PLEASE CONTACT OFFICE TO SCHEDULE   I hope you have a great rest of your week!  Elon Alas. Abbey Chatters, D.O. Gastroenterology and Hepatology [facility]

## 2021-09-20 NOTE — Op Note (Signed)
[facility] Patient Name: Annette Chavez Procedure Date: 09/20/2021 7:02 AM MRN: [id number] Date of Birth: [birth year] Attending MD: Elon Alas. Abbey Chatters DO CSN: [id number] Age: 66 Admit Type: Outpatient Procedure:                Colonoscopy Indications:              Chronic diarrhea Providers:                Elon Alas. Abbey Chatters, DO, Lambert Mody, Aram Candela Referring MD:              Medicines:                See the Anesthesia note for documentation of the                            administered medications Complications:            No immediate complications. Estimated Blood Loss:     Estimated blood loss was minimal. Procedure:                Pre-Anesthesia Assessment:                           - The anesthesia plan was to use monitored                            anesthesia care (MAC).                           After obtaining informed consent, the colonoscope                            was passed under direct vision. Throughout the                            procedure, the patient's blood pressure, pulse, and                            oxygen saturations were monitored continuously. The                            [id number]-HQ190L ([id number]) scope was introduced through                            the anus and advanced to the the cecum, identified                            by appendiceal orifice and ileocecal valve. The                            colonoscopy was performed without difficulty. The                            patient tolerated the procedure well. The quality  of the bowel preparation was evaluated using the                            BBPS [facility] Bowel Preparation Scale) with scores                            of: Right Colon = 3, Transverse Colon = 3 and Left                            Colon = 3 (entire mucosa seen well with no residual                            staining, small fragments of stool or opaque                             liquid). The total BBPS score equals 9. Scope In: 7:50:43 AM Scope Out: 8:02:38 AM Scope Withdrawal Time: 0 hours 9 minutes 6 seconds  Total Procedure Duration: 0 hours 11 minutes 55 seconds  Findings:      The perianal and digital rectal examinations were normal.      Multiple small-mouthed diverticula were found in the sigmoid colon.      The terminal ileum appeared normal.      Biopsies for histology were taken with a cold forceps from the ascending       colon, transverse colon and descending colon for evaluation of       microscopic colitis.      Biopsies were taken with a cold forceps in the rectum for histology. No       residual cancer identified in entire rectum.      Non-bleeding internal hemorrhoids were found during endoscopy. Impression:               - Diverticulosis in the sigmoid colon.                           - The examined portion of the ileum was normal.                           - Non-bleeding internal hemorrhoids.                           - Biopsies were taken with a cold forceps from the                            ascending colon, transverse colon and descending                            colon for evaluation of microscopic colitis.                           - Biopsies were taken with a cold forceps for                            histology in the rectum. Moderate Sedation:      Per Anesthesia Care Recommendation:           -  Patient has a contact number available for                            emergencies. The signs and symptoms of potential                            delayed complications were discussed with the                            patient. Return to normal activities tomorrow.                            Written discharge instructions were provided to the                            patient.                           - Resume previous diet.                           - Continue present medications.                           -  Await pathology results.                           - Repeat colonoscopy in 5 years for surveillance.                           - Return to GI clinic in 4 months. Procedure Code(s):        --- Professional ---                           [phone], Colonoscopy, flexible; with biopsy, single                            or multiple Diagnosis Code(s):        --- Professional ---                           K52.9, Noninfective gastroenteritis and colitis,                            unspecified                           K57.30, Diverticulosis of large intestine without                            perforation or abscess without bleeding CPT copyright 2019 American Medical Association. All rights reserved. The codes documented in this report are preliminary and upon coder review may  be revised to meet current compliance requirements. Elon Alas. Abbey Chatters, DO [place] Abbey Chatters, DO 09/20/2021 8:07:03 AM This report has been signed electronically. Number of Addenda: 0

## 2021-09-20 NOTE — Anesthesia Postprocedure Evaluation (Signed)
Anesthesia Post Note  Patient: Annette Chavez  Procedure(s) Performed: COLONOSCOPY WITH PROPOFOL BIOPSY  Patient location during evaluation: Endoscopy Anesthesia Type: General Level of consciousness: awake and alert and oriented Pain management: pain level controlled Vital Signs Assessment: post-procedure vital signs reviewed and stable Respiratory status: spontaneous breathing, nonlabored ventilation and respiratory function stable Cardiovascular status: blood pressure returned to baseline and stable Postop Assessment: no apparent nausea or vomiting Anesthetic complications: no   No notable events documented.   Last Vitals:  Vitals:   09/20/21 0806 09/20/21 0811  BP: (!) 106/37 103/77  Pulse:    Resp: (!) 25   Temp: 36.9 C   SpO2: 95%     Last Pain:  Vitals:   09/20/21 0811  TempSrc:   PainSc: 0-No pain                 Jersi Mcmaster C Ismelda Weatherman

## 2021-09-20 NOTE — H&P (Signed)
Primary Care Physician:  Rosine Door Primary Gastroenterologist:  Dr. Abbey Chatters  Pre-Procedure History & Physical: HPI:  Annette Chavez is a 66 y.o. female is here for a colonoscopy to be performed for history of squamous cell carcinoma of the rectum as well as chronic diarrhea.  Past Medical History:  Diagnosis Date   Anxiety    Asthma    History of rectal cancer    Insomnia    Low back pain     Past Surgical History:  Procedure Laterality Date   BREAST SURGERY     CATARACT EXTRACTION Bilateral    CERVICAL FUSION     LEFT HEART CATH AND CORONARY ANGIOGRAPHY N/A 09/11/2017   Procedure: LEFT HEART CATH AND CORONARY ANGIOGRAPHY;  Surgeon: Jettie Booze, MD;  Location: [facility];  Service: Cardiovascular;  Laterality: N/A;   RECTAL SURGERY      Prior to Admission medications   Medication Sig Start Date End Date Taking? Authorizing Provider  albuterol (PROVENTIL HFA;VENTOLIN HFA) 108 (90 BASE) MCG/ACT inhaler Inhale 2 puffs into the lungs 2 (two) times daily as needed for wheezing or shortness of breath.   Yes [provider]  ALPRAZolam Duanne Moron) 1 MG tablet Take 1 mg by mouth 3 (three) times daily as needed for anxiety or sleep.   Yes [provider]  ipratropium-albuterol (DUONEB) 0.5-2.5 (3) MG/3ML SOLN Take 3 mLs by nebulization every 6 (six) hours as needed (for shortness of breath or wheezing).    Yes [provider]  mirabegron ER (MYRBETRIQ) 50 MG TB24 tablet Take 1 tablet (50 mg total) by mouth daily. 06/13/21  Yes Stoneking, Reece Leader., MD  nitrofurantoin, macrocrystal-monohydrate, (MACROBID) 100 MG capsule Take 1 capsule (100 mg total) by mouth daily. 05/21/21  Yes Stoneking, Reece Leader., MD  NON FORMULARY Apply 1 application topically daily as needed (pain). CBD cream   Yes [provider]  omeprazole (PRILOSEC) 20 MG capsule Take 20 mg by mouth daily as needed (acid reflux).   Yes [provider]   traZODone (DESYREL) 100 MG tablet Take 150 mg by mouth at bedtime as needed. 08/13/21  Yes [provider]  aspirin EC 81 MG tablet Take 1 tablet (81 mg total) by mouth daily. 10/01/17   Satira Sark, MD  diphenhydrAMINE HCl (ALLERGY MED PO) Take 1 tablet by mouth daily as needed (allergies).    [provider]  EPINEPHrine 0.3 mg/0.3 mL IJ SOAJ injection Inject 0.3 mLs (0.3 mg total) into the muscle once. For severe allergic reaction 12/06/14   Rancour, Annie Main, MD  fluticasone-salmeterol (ADVAIR HFA) [phone] MCG/ACT inhaler Inhale 2 puffs into the lungs 2 (two) times daily as needed (COPD/Asthma).    [provider]  ibuprofen (ADVIL,MOTRIN) 800 MG tablet Take 800 mg by mouth every 8 (eight) hours as needed for headache or moderate pain.    [provider]  mometasone (NASONEX) 50 MCG/ACT nasal spray Place 2 sprays into the nose daily as needed (for allergies).  Patient not taking: Reported on 08/16/2021    [provider]  omeprazole (PRILOSEC OTC) 20 MG tablet Take 20 mg by mouth daily as needed (Heartburn).    [provider]    Allergies as of 08/08/2021 - Review Complete 08/08/2021  Allergen Reaction Noted   Levaquin [levofloxacin] Other (See Comments) 12/06/2014   Rocephin [ceftriaxone] Other (See Comments) 12/06/2014   Penicillins Rash and Other (See Comments) 12/06/2014    Family History  Problem Relation Age  of Onset   Heart attack Father    Heart attack Mother     Social History   Socioeconomic History   Marital status: Married    Spouse name: Not on file   Number of children: Not on file   Years of education: Not on file   Highest education level: Not on file  Occupational History   Not on file  Tobacco Use   Smoking status: Former    Packs/day: 1.00    Types: Cigarettes   Smokeless tobacco: Never  Substance and Sexual Activity   Alcohol use: No    Alcohol/week: 0.0 standard drinks   Drug use: No   Sexual  activity: Not on file  Other Topics Concern   Not on file  Social History Narrative   Not on file   Social Determinants of Health   Financial Resource Strain: Not on file  Food Insecurity: Not on file  Transportation Needs: Not on file  Physical Activity: Not on file  Stress: Not on file  Social Connections: Not on file  Intimate Partner Violence: Not on file    Review of Systems: See HPI, otherwise negative ROS  Physical Exam: Vital signs in last 24 hours: Temp:  [97.8 F (36.6 C)] 97.8 F (36.6 C) (02/24 0654) Pulse Rate:  [76] 76 (02/24 0654) Resp:  [17] 17 (02/24 0654) BP: (143)/(57) 143/57 (02/24 0654) SpO2:  [99 %] 99 % (02/24 0654) Weight:  [63.5 kg] 63.5 kg (02/24 0648)   General:   Alert,  Well-developed, well-nourished, pleasant and cooperative in NAD Head:  Normocephalic and atraumatic. Eyes:  Sclera clear, no icterus.   Conjunctiva pink. Ears:  Normal auditory acuity. Nose:  No deformity, discharge,  or lesions. Mouth:  No deformity or lesions, dentition normal. Neck:  Supple; no masses or thyromegaly. Lungs:  Clear throughout to auscultation.   No wheezes, crackles, or rhonchi. No acute distress. Heart:  Regular rate and rhythm; no murmurs, clicks, rubs,  or gallops. Abdomen:  Soft, nontender and nondistended. No masses, hepatosplenomegaly or hernias noted. Normal bowel sounds, without guarding, and without rebound.   Msk:  Symmetrical without gross deformities. Normal posture. Extremities:  Without clubbing or edema. Neurologic:  Alert and  oriented x4;  grossly normal neurologically. Skin:  Intact without significant lesions or rashes. Cervical Nodes:  No significant cervical adenopathy. Psych:  Alert and cooperative. Normal mood and affect.  Impression/Plan: Annette Chavez is here for a colonoscopy to be performed for history of squamous cell carcinoma of the rectum as well as chronic diarrhea.  The risks of the procedure including infection,  bleed, or perforation as well as benefits, limitations, alternatives and imponderables have been reviewed with the patient. Questions have been answered. All parties agreeable.

## 2021-09-20 NOTE — Anesthesia Preprocedure Evaluation (Signed)
Anesthesia Evaluation  Patient identified by MRN, date of birth, ID band Patient awake    Reviewed: Allergy & Precautions, NPO status , Patient's Chart, lab work & pertinent test results  Airway Mallampati: II  TM Distance: >3 FB Neck ROM: Full    Dental  (+) Dental Advisory Given, Missing,    Pulmonary asthma , COPD,  COPD inhaler, former smoker,    Pulmonary exam normal breath sounds clear to auscultation       Cardiovascular negative cardio ROS Normal cardiovascular exam Rhythm:Regular Rate:Normal     Neuro/Psych PSYCHIATRIC DISORDERS Anxiety negative neurological ROS     GI/Hepatic Neg liver ROS, GERD  Medicated and Controlled,  Endo/Other  negative endocrine ROS  Renal/GU negative Renal ROS  negative genitourinary   Musculoskeletal negative musculoskeletal ROS (+)   Abdominal   Peds negative pediatric ROS (+)  Hematology negative hematology ROS (+)   Anesthesia Other Findings   Reproductive/Obstetrics negative OB ROS                             Anesthesia Physical Anesthesia Plan  ASA: 3  Anesthesia Plan: General   Post-op Pain Management: Minimal or no pain anticipated   Induction:   PONV Risk Score and Plan: TIVA  Airway Management Planned: Nasal Cannula and Natural Airway  Additional Equipment:   Intra-op Plan:   Post-operative Plan:   Informed Consent: I have reviewed the patients History and Physical, chart, labs and discussed the procedure including the risks, benefits and alternatives for the proposed anesthesia with the patient or authorized representative who has indicated his/her understanding and acceptance.     Dental advisory given  Plan Discussed with: CRNA and Surgeon  Anesthesia Plan Comments:         Anesthesia Quick Evaluation

## 2021-09-23 LAB — SURGICAL PATHOLOGY

## 2021-09-25 ENCOUNTER — Encounter (HOSPITAL_COMMUNITY): Payer: Self-pay | Admitting: Internal Medicine

## 2021-11-07 DIAGNOSIS — R609 Edema, unspecified: Secondary | ICD-10-CM | POA: Diagnosis not present

## 2021-11-07 DIAGNOSIS — N390 Urinary tract infection, site not specified: Secondary | ICD-10-CM | POA: Diagnosis not present

## 2021-12-09 ENCOUNTER — Encounter: Payer: Self-pay | Admitting: Internal Medicine

## 2021-12-12 DIAGNOSIS — R600 Localized edema: Secondary | ICD-10-CM | POA: Diagnosis not present

## 2021-12-12 DIAGNOSIS — G4700 Insomnia, unspecified: Secondary | ICD-10-CM | POA: Diagnosis not present

## 2021-12-12 DIAGNOSIS — R079 Chest pain, unspecified: Secondary | ICD-10-CM | POA: Diagnosis not present

## 2021-12-12 DIAGNOSIS — R609 Edema, unspecified: Secondary | ICD-10-CM | POA: Diagnosis not present

## 2021-12-12 DIAGNOSIS — M7122 Synovial cyst of popliteal space [Baker], left knee: Secondary | ICD-10-CM | POA: Diagnosis not present

## 2021-12-12 DIAGNOSIS — I10 Essential (primary) hypertension: Secondary | ICD-10-CM | POA: Diagnosis not present

## 2021-12-12 DIAGNOSIS — Z720 Tobacco use: Secondary | ICD-10-CM | POA: Diagnosis not present

## 2021-12-22 DIAGNOSIS — R079 Chest pain, unspecified: Secondary | ICD-10-CM | POA: Diagnosis not present

## 2021-12-22 DIAGNOSIS — A419 Sepsis, unspecified organism: Secondary | ICD-10-CM | POA: Diagnosis not present

## 2021-12-22 DIAGNOSIS — Z880 Allergy status to penicillin: Secondary | ICD-10-CM | POA: Diagnosis not present

## 2021-12-22 DIAGNOSIS — J9601 Acute respiratory failure with hypoxia: Secondary | ICD-10-CM | POA: Diagnosis not present

## 2021-12-22 DIAGNOSIS — Z20822 Contact with and (suspected) exposure to covid-19: Secondary | ICD-10-CM | POA: Diagnosis not present

## 2021-12-22 DIAGNOSIS — J439 Emphysema, unspecified: Secondary | ICD-10-CM | POA: Diagnosis not present

## 2021-12-22 DIAGNOSIS — Z7951 Long term (current) use of inhaled steroids: Secondary | ICD-10-CM | POA: Diagnosis not present

## 2021-12-22 DIAGNOSIS — Z87891 Personal history of nicotine dependence: Secondary | ICD-10-CM | POA: Diagnosis not present

## 2021-12-22 DIAGNOSIS — J440 Chronic obstructive pulmonary disease with acute lower respiratory infection: Secondary | ICD-10-CM | POA: Diagnosis not present

## 2021-12-22 DIAGNOSIS — Z79899 Other long term (current) drug therapy: Secondary | ICD-10-CM | POA: Diagnosis not present

## 2021-12-22 DIAGNOSIS — F17290 Nicotine dependence, other tobacco product, uncomplicated: Secondary | ICD-10-CM | POA: Diagnosis not present

## 2021-12-22 DIAGNOSIS — R059 Cough, unspecified: Secondary | ICD-10-CM | POA: Diagnosis not present

## 2021-12-22 DIAGNOSIS — J9811 Atelectasis: Secondary | ICD-10-CM | POA: Diagnosis not present

## 2021-12-22 DIAGNOSIS — Z8616 Personal history of COVID-19: Secondary | ICD-10-CM | POA: Diagnosis not present

## 2021-12-22 DIAGNOSIS — U070 Vaping-related disorder: Secondary | ICD-10-CM | POA: Diagnosis not present

## 2021-12-22 DIAGNOSIS — Z9981 Dependence on supplemental oxygen: Secondary | ICD-10-CM | POA: Diagnosis not present

## 2021-12-22 DIAGNOSIS — J9690 Respiratory failure, unspecified, unspecified whether with hypoxia or hypercapnia: Secondary | ICD-10-CM | POA: Diagnosis not present

## 2021-12-22 DIAGNOSIS — R0902 Hypoxemia: Secondary | ICD-10-CM | POA: Diagnosis not present

## 2021-12-22 DIAGNOSIS — Z609 Problem related to social environment, unspecified: Secondary | ICD-10-CM | POA: Diagnosis not present

## 2021-12-22 DIAGNOSIS — J90 Pleural effusion, not elsewhere classified: Secondary | ICD-10-CM | POA: Diagnosis not present

## 2021-12-22 DIAGNOSIS — J441 Chronic obstructive pulmonary disease with (acute) exacerbation: Secondary | ICD-10-CM | POA: Diagnosis not present

## 2021-12-22 DIAGNOSIS — Z881 Allergy status to other antibiotic agents status: Secondary | ICD-10-CM | POA: Diagnosis not present

## 2021-12-24 DIAGNOSIS — J90 Pleural effusion, not elsewhere classified: Secondary | ICD-10-CM | POA: Diagnosis not present

## 2021-12-26 ENCOUNTER — Institutional Professional Consult (permissible substitution): Payer: Medicare Other | Admitting: Pulmonary Disease

## 2021-12-27 DIAGNOSIS — R0902 Hypoxemia: Secondary | ICD-10-CM | POA: Diagnosis not present

## 2021-12-27 DIAGNOSIS — E876 Hypokalemia: Secondary | ICD-10-CM | POA: Diagnosis not present

## 2021-12-27 DIAGNOSIS — D649 Anemia, unspecified: Secondary | ICD-10-CM | POA: Diagnosis not present

## 2022-01-11 DIAGNOSIS — I2510 Atherosclerotic heart disease of native coronary artery without angina pectoris: Secondary | ICD-10-CM | POA: Diagnosis not present

## 2022-01-11 DIAGNOSIS — I700 Atherosclerosis of aorta: Secondary | ICD-10-CM | POA: Diagnosis not present

## 2022-01-11 DIAGNOSIS — Z87891 Personal history of nicotine dependence: Secondary | ICD-10-CM | POA: Diagnosis not present

## 2022-01-11 DIAGNOSIS — Z881 Allergy status to other antibiotic agents status: Secondary | ICD-10-CM | POA: Diagnosis not present

## 2022-01-11 DIAGNOSIS — Z79899 Other long term (current) drug therapy: Secondary | ICD-10-CM | POA: Diagnosis not present

## 2022-01-11 DIAGNOSIS — J449 Chronic obstructive pulmonary disease, unspecified: Secondary | ICD-10-CM | POA: Diagnosis not present

## 2022-01-11 DIAGNOSIS — K8020 Calculus of gallbladder without cholecystitis without obstruction: Secondary | ICD-10-CM | POA: Diagnosis not present

## 2022-01-11 DIAGNOSIS — J90 Pleural effusion, not elsewhere classified: Secondary | ICD-10-CM | POA: Diagnosis not present

## 2022-01-11 DIAGNOSIS — R0602 Shortness of breath: Secondary | ICD-10-CM | POA: Diagnosis not present

## 2022-01-11 DIAGNOSIS — Z880 Allergy status to penicillin: Secondary | ICD-10-CM | POA: Diagnosis not present

## 2022-01-11 DIAGNOSIS — J9811 Atelectasis: Secondary | ICD-10-CM | POA: Diagnosis not present

## 2022-01-14 ENCOUNTER — Ambulatory Visit (HOSPITAL_COMMUNITY)
Admission: RE | Admit: 2022-01-14 | Discharge: 2022-01-14 | Disposition: A | Discharge status: Home / Self Care | Payer: Medicare Other | Source: Ambulatory Visit | Attending: Internal Medicine | Admitting: Internal Medicine

## 2022-01-14 ENCOUNTER — Encounter: Payer: Self-pay | Admitting: Internal Medicine

## 2022-01-14 ENCOUNTER — Ambulatory Visit: Payer: Medicare Other | Admitting: Internal Medicine

## 2022-01-14 DIAGNOSIS — R0609 Other forms of dyspnea: Secondary | ICD-10-CM | POA: Diagnosis not present

## 2022-01-14 DIAGNOSIS — J449 Chronic obstructive pulmonary disease, unspecified: Secondary | ICD-10-CM

## 2022-01-14 DIAGNOSIS — J90 Pleural effusion, not elsewhere classified: Secondary | ICD-10-CM | POA: Diagnosis not present

## 2022-01-14 MED ORDER — BUDESONIDE-FORMOTEROL FUMARATE 80-4.5 MCG/ACT IN AERO: INHALATION_SPRAY | RESPIRATORY_TRACT | 12 refills | Status: AC

## 2022-01-14 MED ORDER — FAMOTIDINE 20 MG PO TABS: ORAL_TABLET | ORAL | 11 refills | Status: AC

## 2022-01-14 NOTE — Progress Notes (Unsigned)
Annette Chavez, female    DOB: August 11, 1955    MRN: [id number]   Brief patient profile:  55   yowf  grew up with smokers/ quit smoking 2021-2022  but still vaping with lifelong episodes of AB in winter (rx advair /alb hfa/neb)  but no need for meds between then pattern changed with persistent cough/ sob end of April 2023 referred to pulmonary clinic in [place]  01/14/2022 by [facility] p admit:   Admit date: 12/22/2021  Discharge date: 12/24/2021   Discharge to: Home with self-care, pulmonology follow-up, and home O2     Discharge Diagnoses:  Pneumonia due to infectious organism Active Problems: Hypoxia Vaping-related disorder Loculated pleural effusion Compressive atelectasis Parapneumonic effusion     Procedures: 2 view chest x-ray-12/22/2021 CTA chest with contrast-12/22/2021 Ultrasound chest bilateral-12/24/2021 Attempted thoracentesis by IR-12/24/2021 (no adequate fluid pocket visualized)  Consults:  Interventional radiology  Home Health/DME: DME: Home O2 at [employer] Hospital Course:  Annette Chavez is a 66 y.o. female that presented to [facility] and was admitted for Pneumonia due to infectious organism on 12/22/2021 8:51 AM .  Patient admitted on 12/22/2021 due to complex pneumonia with probable parapneumonic loculated effusion. Patient initially presented with shortness of breath, hypoxemia based on ABG with PaO2 of 52 and requiring supplemental O2 in order to maintain adequate oxygenation greater than 90%. Patient weaned down to 2 L this morning and oxygen tested for home O2 use. Patient did desaturate at rest to 87% but quickly rebounded when 2 L was put back in place. Case management working on arranging home O2, patient has had this in the past after her COVID-19 about 1 year ago. Patient has never seen a pulmonologist, talked with patient and patient's husband at bedside about pulmonology referral due to loculated pleural effusion/parapneumonic  effusion due to complex pneumonia. Patient agreeable to pulmonology follow-up, would prefer [facility]. She intends to follow-up with Tawni Carnes at [facility]. Patient has Rocephin, penicillin, levofloxacin, and ciprofloxacin listed as allergies. She has recently been prescribed and completed a course of ciprofloxacin and reports no allergy, this was prescribed to her on 11/07/2021. Discussed possible oral antibiotic treatment with patient and with pharmacy staff to minimize allergic reaction potential and maximize benefit/recovery. Ultimately patient prescribed moxifloxacin 400 mg daily x14 days plus Flagyl 500 mg 3 times daily x14 days. Case management working on pulmonology referral in [place]. Strict return precautions given to patient and patient's husband. She is awaiting home O2 delivery in order to discharge home.  Issues addressed during current hospitalization:  1. Multifocal pneumonia with associated parapneumonic loculated effusion Patient was on IV aztreonam and oral doxycycline while inpatient Several drug reported allergies, recent prescription filled and completed for ciprofloxacin and patient tolerated well Will discharge with moxifloxacin and Flagyl for loculated effusion Pulmonology referral Attempted/unsuccessful thoracentesis, no adequate fluid pocket identified per IR  2. Acute hypoxic respiratory failure Secondary to #1 Continue empiric antibiotics Pulmonology referral Patient qualified for home O2 at 2 L continuously  3. Vape use Counseled on cessation    Only take 5 d avelox/flagyl and then worse again > er 6/17 rx clindamycin and 50% improved    History of Present Illness  01/14/2022  Pulmonary/ 1st office eval/ Annette Chavez / [facility]  Chief Complaint  Patient presents with   Consult    F/u from [facility] for pneumonia and bilateral pleural effusions.  CT done on 6/17   Dyspnea:  sob p  room in room  Cough: white mucus with hard cough > ant cp   Sleep: bed is flat/ one pillow  SABA use: advair and albuterol this am and neb all around   Past Medical History:  Diagnosis Date   Anxiety    Asthma    History of rectal cancer    Insomnia    Low back pain     Outpatient Medications Prior to Visit  Medication Sig Dispense Refill   albuterol (PROVENTIL HFA;VENTOLIN HFA) 108 (90 BASE) MCG/ACT inhaler Inhale 2 puffs into the lungs 2 (two) times daily as needed for wheezing or shortness of breath.     ALPRAZolam (XANAX) 1 MG tablet Take 1 mg by mouth 3 (three) times daily as needed for anxiety or sleep.     aspirin EC 81 MG tablet Take 1 tablet (81 mg total) by mouth daily. 30 tablet 3   clindamycin (CLEOCIN) 300 MG capsule Take 300 mg by mouth 3 (three) times daily.     diphenhydrAMINE HCl (ALLERGY MED PO) Take 1 tablet by mouth daily as needed (allergies).     EPINEPHrine 0.3 mg/0.3 mL IJ SOAJ injection Inject 0.3 mLs (0.3 mg total) into the muscle once. For severe allergic reaction 1 Device 1   fluticasone-salmeterol (ADVAIR HFA) 115-21 MCG/ACT inhaler Inhale 2 puffs into the lungs 2 (two) times daily as needed (COPD/Asthma).     ibuprofen (ADVIL,MOTRIN) 800 MG tablet Take 800 mg by mouth every 8 (eight) hours as needed for headache or moderate pain.     ipratropium-albuterol (DUONEB) 0.5-2.5 (3) MG/3ML SOLN Take 3 mLs by nebulization every 6 (six) hours as needed (for shortness of breath or wheezing).      mirabegron ER (MYRBETRIQ) 50 MG TB24 tablet Take 1 tablet (50 mg total) by mouth daily. 28 tablet 0   mometasone (NASONEX) 50 MCG/ACT nasal spray Place 2 sprays into the nose daily as needed (for allergies).     nitrofurantoin, macrocrystal-monohydrate, (MACROBID) 100 MG capsule Take 1 capsule (100 mg total) by mouth daily. 30 capsule 2   NON FORMULARY Apply 1 application topically daily as needed (pain). CBD cream     omeprazole (PRILOSEC OTC) 20 MG tablet Take 20 mg by mouth daily as needed (Heartburn).     omeprazole (PRILOSEC) 20  MG capsule Take 20 mg by mouth daily as needed (acid reflux).     traZODone (DESYREL) 100 MG tablet Take 150 mg by mouth at bedtime as needed.     No facility-administered medications prior to visit.     Objective:     BP 136/84 (BP Location: Left Arm, Patient Position: Sitting)   Pulse 84   Temp 97.7 F (36.5 C) (Temporal)   Ht '5\' 3"'$  (1.6 m)   Wt 151 lb (68.5 kg)   SpO2 96% Comment: ra  BMI 26.75 kg/m   SpO2: 96 % (ra)      Assessment   No problem-specific Assessment & Plan notes found for this encounter.     Christinia Gully, MD 01/14/2022

## 2022-01-14 NOTE — Patient Instructions (Addendum)
Plan A = Automatic = Always=    Symbicort 80 Take 2 puffs first thing in am and then another 2 puffs about 12 hours later.    Work on inhaler technique:  relax and gently blow all the way out then take a nice smooth full deep breath back in, triggering the inhaler at same time you start breathing in.  Hold for up to 5 seconds if you can. Blow out thru nose. Rinse and gargle with water when done.  If mouth or throat bother you at all,  try brushing teeth/gums/tongue with arm and hammer toothpaste/ make a slurry and gargle and spit out.     Plan B = Backup (to supplement plan A, not to replace it) Only use your albuterol inhaler as a rescue medication to be used if you can't catch your breath by resting or doing a relaxed purse lip breathing pattern.  - The less you use it, the better it will work when you need it. - Ok to use the inhaler up to 2 puffs  every 4 hours if you must but call for appointment if use goes up over your usual need - Don't leave home without it !!  (think of it like the spare tire for your car)    Plan C = Crisis (instead of Plan B but only if Plan B stops working) - only use your albuterol nebulizer if you first try Plan B and it fails to help > ok to use the nebulizer up to every 4 hours but if start needing it regularly call for immediate appointment   Use your cough medication for now as much as needed to suppress the honking cough   Prilosec (omeprazole)  20 mg Take x 2 pills 30-60 min before first meal of the day  and Pepcid 20 mg one after supper daily whether you think you need them or not   GERD (REFLUX)  is an extremely common cause of respiratory symptoms just like yours , many times with no obvious heartburn at all.    It can be treated with medication, but also with lifestyle changes including elevation of the head of your bed (ideally with 6 -8inch blocks under the headboard of your bed),  Smoking cessation, avoidance of late meals, excessive alcohol, and  avoid fatty foods, chocolate, peppermint, colas, red wine, and acidic juices such as orange juice.  NO MINT OR MENTHOL PRODUCTS SO NO COUGH DROPS  USE SUGARLESS CANDY INSTEAD (Jolley ranchers or Stover's or Life Savers) or even ice chips will also do - the key is to swallow to prevent all throat clearing. NO OIL BASED VITAMINS - use powdered substitutes.  Avoid fish oil when coughing.   Stop advair and all vaping  Please remember to go to the lab department   for your tests - we will call you with the results when they are available.      Please remember to go to the  x-ray department  @  [facility] for your tests - we will call you with the results when they are available     Call with any discrepancies on your medication list    Please schedule a follow up office visit in 2 weeks, call sooner if needed with all medications /inhalers/ solutions in hand so we can verify exactly what you are taking. This includes all medications from all doctors and over the [place] separate them into two bags:  the ones you take  automatically, no matter what, vs the ones you take just when you feel you need them "BAG #2 is UP TO YOU"  - this will really help [place] help you take your medications more effectively.

## 2022-01-16 ENCOUNTER — Encounter: Payer: Self-pay | Admitting: Internal Medicine

## 2022-01-16 DIAGNOSIS — J449 Chronic obstructive pulmonary disease, unspecified: Secondary | ICD-10-CM | POA: Insufficient documentation

## 2022-01-16 LAB — CBC WITH DIFFERENTIAL/PLATELET
Basophils Absolute: 0 10*3/uL (ref 0.0–0.2)
Basos: 1 %
EOS (ABSOLUTE): 0.1 10*3/uL (ref 0.0–0.4)
Eos: 1 %
Hematocrit: 31.3 % — ABNORMAL LOW (ref 34.0–46.6)
Hemoglobin: 10.4 g/dL — ABNORMAL LOW (ref 11.1–15.9)
Immature Grans (Abs): 0 10*3/uL (ref 0.0–0.1)
Immature Granulocytes: 0 %
Lymphocytes Absolute: 1.2 10*3/uL (ref 0.7–3.1)
Lymphs: 16 %
MCH: 28.3 pg (ref 26.6–33.0)
MCHC: 33.2 g/dL (ref 31.5–35.7)
MCV: 85 fL (ref 79–97)
Monocytes Absolute: 0.7 10*3/uL (ref 0.1–0.9)
Monocytes: 9 %
Neutrophils Absolute: 5.6 10*3/uL (ref 1.4–7.0)
Neutrophils: 73 %
Platelets: 314 10*3/uL (ref 150–450)
RBC: 3.68 x10E6/uL — ABNORMAL LOW (ref 3.77–5.28)
RDW: 14.5 % (ref 11.7–15.4)
WBC: 7.7 10*3/uL (ref 3.4–10.8)

## 2022-01-16 LAB — BASIC METABOLIC PANEL
BUN/Creatinine Ratio: 18 (ref 12–28)
BUN: 14 mg/dL (ref 8–27)
CO2: 21 mmol/L (ref 20–29)
Calcium: 9.2 mg/dL (ref 8.7–10.3)
Chloride: 102 mmol/L (ref 96–106)
Creatinine, Ser: 0.77 mg/dL (ref 0.57–1.00)
Glucose: 95 mg/dL (ref 70–99)
Potassium: 4.4 mmol/L (ref 3.5–5.2)
Sodium: 143 mmol/L (ref 134–144)
eGFR: 84 mL/min/{1.73_m2} (ref >59)

## 2022-01-16 LAB — SEDIMENTATION RATE: Sed Rate: 41 mm/hr — ABNORMAL HIGH (ref 0–40)

## 2022-01-16 LAB — D-DIMER, QUANTITATIVE: D-DIMER: 4.13 mg/L FEU — ABNORMAL HIGH (ref 0.00–0.49)

## 2022-01-16 NOTE — Assessment & Plan Note (Addendum)
Onset pna end of  April 6578 complicated by parapneumonic effusions R > L  9see admit 12/22/21)  - 01/14/2022   Walked on RA  x  3  lap(s) =  approx 450  ft  @ moderate pace, stopped due to sob/dizzy  with lowest 02 sats 92%   She clearly has not recovered from the parapneumonic process with concern also a relatively high d dimer suggesting the possibility of DVT/PE though pt's with chronic dz frequently have non specific elevations as is likely the case here   rec Venous dopplers to be compete (just had a contrast CT 01/11/22 and my index of suspicion is low  Consult T surgery re ? Need for VATS?   Discussed in detail all the  indications, usual  risks and alternatives  relative to the benefits with patient who agrees to proceed with w/u as outlined.

## 2022-01-16 NOTE — Assessment & Plan Note (Addendum)
Quit smoking 2021 - changed advair to symbicort 80 2bid  01/14/2022 >>>  DDX of  difficult airways management almost all start with A and  include Adherence, Ace Inhibitors, Acid Reflux, Active Sinus Disease, Alpha 1 Antitripsin deficiency, Anxiety masquerading as Airways dz,  ABPA,  Allergy(esp in young), Aspiration (esp in elderly), Adverse effects of meds,  Active smoking or vaping, A bunch of PE's (a small clot burden can't cause this syndrome unless there is already severe underlying pulm or vascular dz with poor reserve) plus two Bs  = Bronchiectasis and Beta blocker use..and one C= CHF  Adherence is always the initial "prime suspect" and is a multilayered concern that requires a "trust but verify" approach in every patient - starting with knowing how to use medications, especially inhalers, correctly, keeping up with refills and understanding the fundamental difference between maintenance and prns vs those medications only taken for a very short course and then stopped and not refilled.  - return with all meds in hand using a trust but verify approach to confirm accurate Medication  Reconciliation The principal here is that until we are certain that the  patients are doing what we've asked, it makes no sense to ask them to do more.  - The proper method of use, as well as anticipated side effects, of a metered-dose inhaler were discussed and demonstrated to the patient using teach back method.   ? Adverse effects from macrodantin > nothing to support but would avoid in this setting   ? Acid (or non-acid) GERD > always difficult to exclude as up to 75% of pts in some series report no assoc GI/ Heartburn symptoms> rec max (24h)  acid suppression and diet restrictions/ reviewed and instructions given in writing.   ? A bunch of PE's > unlikely despite pos d dimer > see doe ddx   Anemia of chronic dz also a concern here but no need to specifically address now   Lab Results  Component Value Date    HGB 10.4 (L) 01/14/2022   HGB 12.3 09/11/2017   HGB 13.6 12/06/2014   HGB 12.8 12/06/2014      Each maintenance medication was reviewed in detail including emphasizing most importantly the difference between maintenance and prns and under what circumstances the prns are to be triggered using an action plan format where appropriate.  Total time for H and P, chart review, counseling, reviewing hfa device(s) , directly observing portions of ambulatory 02 saturation study/ and generating customized AVS unique to this office visit / same day charting   45 min new pt eval

## 2022-01-17 ENCOUNTER — Other Ambulatory Visit: Payer: Self-pay

## 2022-01-17 DIAGNOSIS — J90 Pleural effusion, not elsewhere classified: Secondary | ICD-10-CM

## 2022-01-23 DIAGNOSIS — D649 Anemia, unspecified: Secondary | ICD-10-CM | POA: Diagnosis not present

## 2022-01-23 DIAGNOSIS — R0902 Hypoxemia: Secondary | ICD-10-CM | POA: Diagnosis not present

## 2022-01-23 DIAGNOSIS — E876 Hypokalemia: Secondary | ICD-10-CM | POA: Diagnosis not present

## 2022-01-29 ENCOUNTER — Ambulatory Visit: Payer: Medicare Other | Admitting: Internal Medicine

## 2022-01-30 ENCOUNTER — Encounter (HOSPITAL_COMMUNITY)
Admission: RE | Admit: 2022-01-30 | Discharge: 2022-01-30 | Disposition: A | Discharge status: Home / Self Care | Payer: Medicare Other | Source: Ambulatory Visit | Attending: Internal Medicine | Admitting: Internal Medicine

## 2022-01-30 ENCOUNTER — Ambulatory Visit (HOSPITAL_COMMUNITY)
Admission: RE | Admit: 2022-01-30 | Discharge: 2022-01-30 | Disposition: A | Discharge status: Home / Self Care | Payer: Medicare Other | Source: Ambulatory Visit | Attending: Thoracic Surgery (Cardiothoracic Vascular Surgery) | Admitting: Thoracic Surgery (Cardiothoracic Vascular Surgery)

## 2022-01-30 DIAGNOSIS — J90 Pleural effusion, not elsewhere classified: Secondary | ICD-10-CM | POA: Insufficient documentation

## 2022-01-30 DIAGNOSIS — R0602 Shortness of breath: Secondary | ICD-10-CM | POA: Diagnosis not present

## 2022-01-30 DIAGNOSIS — R079 Chest pain, unspecified: Secondary | ICD-10-CM | POA: Diagnosis not present

## 2022-01-30 DIAGNOSIS — R059 Cough, unspecified: Secondary | ICD-10-CM | POA: Diagnosis not present

## 2022-01-30 MED ORDER — TECHNETIUM TO 99M ALBUMIN AGGREGATED
3.9600 | Freq: Once | INTRAVENOUS | Status: AC | PRN
  Administered 2022-01-30: 3.96 via INTRAVENOUS

## 2022-02-05 ENCOUNTER — Other Ambulatory Visit: Payer: Self-pay | Admitting: Thoracic Surgery (Cardiothoracic Vascular Surgery)

## 2022-02-05 DIAGNOSIS — J90 Pleural effusion, not elsewhere classified: Secondary | ICD-10-CM

## 2022-02-06 ENCOUNTER — Other Ambulatory Visit: Payer: Self-pay | Admitting: *Deleted

## 2022-02-06 DIAGNOSIS — J90 Pleural effusion, not elsewhere classified: Secondary | ICD-10-CM

## 2022-02-06 NOTE — Progress Notes (Signed)
[address]       ,[place] [id number]             [id number]                    Jahnaya C Jacinto Gordon Medical Record #[id number] Date of Birth: 1956/04/06  Referring: Tanda Rockers, MD Primary Care: Rosalee Kaufman, PA-C Primary Cardiologist: None  Chief Complaint:    Chief Complaint  Patient presents with   Pleural Effusion    CT chest today    History of Present Illness:    Annette Chavez 66 y.o. female referred for surgical evaluation of a loculated parapneumonic effusion.  She was recently seen and treated in May 2023.  She continues to have some exertional dyspnea, as well as nonproductive cough.  Her energy levels have also been low since recovering from that pneumonia.  She quit smoking in 2022, but continues to vape.     Zubrod Score: At the time of surgery this patient's most appropriate activity status/level should be described as: '[]'$     0    Normal activity, no symptoms '[x]'$     1    Restricted in physical strenuous activity but ambulatory, able to do out light work '[]'$     2    Ambulatory and capable of self care, unable to do work activities, up and about               >50 % of waking hours                              '[]'$     3    Only limited self care, in bed greater than 50% of waking hours '[]'$     4    Completely disabled, no self care, confined to bed or chair '[]'$     5    Moribund   Past Medical History:  Diagnosis Date   Anxiety    Asthma    History of rectal cancer    Insomnia    Low back pain     Past Surgical History:  Procedure Laterality Date   BIOPSY  09/20/2021   Procedure: BIOPSY;  Surgeon: Eloise Harman, DO;  Location: [facility];  Service: Endoscopy;;   BREAST SURGERY     CATARACT EXTRACTION Bilateral    CERVICAL FUSION     COLONOSCOPY WITH PROPOFOL N/A 09/20/2021   Procedure: COLONOSCOPY WITH PROPOFOL;  Surgeon: Eloise Harman, DO;  Location: [facility];  Service: Endoscopy;  Laterality:  N/A;  10:30am   LEFT HEART CATH AND CORONARY ANGIOGRAPHY N/A 09/11/2017   Procedure: LEFT HEART CATH AND CORONARY ANGIOGRAPHY;  Surgeon: Jettie Booze, MD;  Location: [facility];  Service: Cardiovascular;  Laterality: N/A;   RECTAL SURGERY      Family History  Problem Relation Age of Onset   Heart attack Father    Heart attack Mother      Social History   Tobacco Use  Smoking Status Former   Packs/day: 1.00   Types: Cigarettes  Smokeless Tobacco Never    Social History   Substance and Sexual Activity  Alcohol Use No   Alcohol/week: 0.0 standard drinks of alcohol     Allergies  Allergen Reactions   Levaquin [Levofloxacin] Other (See Comments)    High blood pressure, convulsing    Rocephin [Ceftriaxone] Other (  See Comments)    High blood pressure, convulsing   Penicillins Rash and Other (See Comments)    Has patient had a PCN reaction causing immediate rash, facial/tongue/throat swelling, SOB or lightheadedness with hypotension: Unknown Has patient had a PCN reaction causing severe rash involving mucus membranes or skin necrosis: Unknown Has patient had a PCN reaction that required hospitalization: Unknown Has patient had a PCN reaction occurring within the last 10 years: No If all of the above answers are "NO", then may proceed with Cephalosporin use.     Current Outpatient Medications  Medication Sig Dispense Refill   albuterol (PROVENTIL HFA;VENTOLIN HFA) 108 (90 BASE) MCG/ACT inhaler Inhale 2 puffs into the lungs 2 (two) times daily as needed for wheezing or shortness of breath.     ALPRAZolam (XANAX) 1 MG tablet Take 1 mg by mouth 3 (three) times daily as needed for anxiety or sleep.     aspirin EC 81 MG tablet Take 1 tablet (81 mg total) by mouth daily. 30 tablet 3   budesonide-formoterol (SYMBICORT) 80-4.5 MCG/ACT inhaler Take 2 puffs first thing in am and then another 2 puffs about 12 hours later. 1 each 12   clindamycin (CLEOCIN) 300 MG capsule  Take 300 mg by mouth 3 (three) times daily.     diphenhydrAMINE HCl (ALLERGY MED PO) Take 1 tablet by mouth daily as needed (allergies).     EPINEPHrine 0.3 mg/0.3 mL IJ SOAJ injection Inject 0.3 mLs (0.3 mg total) into the muscle once. For severe allergic reaction 1 Device 1   famotidine (PEPCID) 20 MG tablet One after supper 30 tablet 11   ibuprofen (ADVIL,MOTRIN) 800 MG tablet Take 800 mg by mouth every 8 (eight) hours as needed for headache or moderate pain.     ipratropium-albuterol (DUONEB) 0.5-2.5 (3) MG/3ML SOLN Take 3 mLs by nebulization every 6 (six) hours as needed (for shortness of breath or wheezing).      mirabegron ER (MYRBETRIQ) 50 MG TB24 tablet Take 1 tablet (50 mg total) by mouth daily. 28 tablet 0   mometasone (NASONEX) 50 MCG/ACT nasal spray Place 2 sprays into the nose daily as needed (for allergies).     NON FORMULARY Apply 1 application topically daily as needed (pain). CBD cream     traZODone (DESYREL) 100 MG tablet Take 150 mg by mouth at bedtime as needed.     No current facility-administered medications for this visit.    Review of Systems  Constitutional:  Positive for malaise/fatigue. Negative for weight loss.  Respiratory:  Positive for cough and shortness of breath.   Cardiovascular:  Positive for chest pain.     PHYSICAL EXAMINATION: BP 138/76 (BP Location: Left Arm, Patient Position: Sitting)   Pulse 88   Resp 18   Ht '5\' 3"'$  (1.6 m)   Wt 143 lb (64.9 kg)   SpO2 97% Comment: RA  BMI 25.33 kg/m  Physical Exam Constitutional:      Appearance: Normal appearance. She is normal weight. She is not ill-appearing.  Eyes:     Extraocular Movements: Extraocular movements intact.  Cardiovascular:     Rate and Rhythm: Normal rate.  Pulmonary:     Effort: Pulmonary effort is normal. No respiratory distress.  Musculoskeletal:        General: Normal range of motion.     Cervical back: Normal range of motion.  Skin:    General: Skin is warm and dry.   Neurological:     General: No focal deficit present.  Mental Status: She is alert and oriented to person, place, and time.     Diagnostic Studies & Laboratory data:     Recent Radiology Findings:   CT CHEST WO CONTRAST  Result Date: 02/07/2022 CLINICAL DATA:  66 year old female with history of pleural effusion. EXAM: CT CHEST WITHOUT CONTRAST TECHNIQUE: Multidetector CT imaging of the chest was performed following the standard protocol without IV contrast. RADIATION DOSE REDUCTION: This exam was performed according to the departmental dose-optimization program which includes automated exposure control, adjustment of the mA and/or kV according to patient size and/or use of iterative reconstruction technique. COMPARISON:  Chest CT 01/11/2022. FINDINGS: Cardiovascular: Heart size is normal. There is no significant pericardial fluid, thickening or pericardial calcification. There is aortic atherosclerosis, as well as atherosclerosis of the great vessels of the mediastinum and the coronary arteries, including calcified atherosclerotic plaque in the left main, left anterior descending, left circumflex and right coronary arteries. Mediastinum/Nodes: No pathologically enlarged mediastinal or hilar lymph nodes. Please note that accurate exclusion of hilar adenopathy is limited on noncontrast CT scans. Esophagus is unremarkable in appearance. No axillary lymphadenopathy. Lungs/Pleura: Previously noted left pleural effusion has resolved. Previously noted loculated right pleural effusion has decreased substantially in size, with some residual loculated pleural fluid noted both anteriorly and in the major fissure. No acute consolidative airspace disease. No pleural effusions. Diffuse bronchial wall thickening with mild centrilobular and paraseptal emphysema. No definite suspicious appearing pulmonary nodules or masses are noted. Upper Abdomen: Aortic atherosclerosis. Calcified gallstones lying dependently in the  gallbladder. Calcified granuloma in the right lobe of the liver. Densely calcified right adrenal gland, presumably sequela of remote right adrenal hemorrhage or infection. Musculoskeletal: Orthopedic fixation hardware in the lower cervical spine incompletely imaged. There are no aggressive appearing lytic or blastic lesions noted in the visualized portions of the skeleton. IMPRESSION: 1. Decreased size of loculated right pleural effusion, as above. 2. Resolved left pleural effusion. 3. Mild diffuse bronchial wall thickening with mild centrilobular and paraseptal emphysema; imaging findings suggestive of underlying COPD. 4. Aortic atherosclerosis, in addition to left main and three-vessel coronary artery disease. Please note that although the presence of coronary artery calcium documents the presence of coronary artery disease, the severity of this disease and any potential stenosis cannot be assessed on this non-gated CT examination. Assessment for potential risk factor modification, dietary therapy or pharmacologic therapy may be warranted, if clinically indicated. Aortic Atherosclerosis (ICD10-I70.0) and Emphysema (ICD10-J43.9). Electronically Signed   By: Vinnie Langton M.D.   On: 02/07/2022 09:04   NM Pulmonary Perfusion  Result Date: 01/30/2022 CLINICAL DATA:  Chest pain, shortness of breath, cough EXAM: NUCLEAR MEDICINE PERFUSION LUNG SCAN TECHNIQUE: Perfusion images were obtained in multiple projections after intravenous injection of radiopharmaceutical. Ventilation scans intentionally deferred if perfusion scan and chest x-ray adequate for interpretation during COVID 19 epidemic. RADIOPHARMACEUTICALS:  3.96 mCi Tc-51mMAA IV COMPARISON:  Chest x-ray 01/30/2022 FINDINGS: Planar imaging of the chest was obtained in multiple projections after the intravenous administration of radiotracer. There is a matched perfusion defect corresponding to the loculated fluid within the right major fissure. There are no  wedge-shaped perfusion defects to suggest pulmonary embolus. IMPRESSION: 1. No evidence of pulmonary embolus. Matched defect at the right lung base corresponding to loculated pleural fluid seen on corresponding chest x-ray. Electronically Signed   By: MRanda NgoM.D.   On: 01/30/2022 20:06   DG Chest 2 View  Result Date: 01/30/2022 CLINICAL DATA:  Pleural effusion. EXAM: CHEST - 2  VIEW COMPARISON:  Multiple priors, including radiographs 01/14/2022 and 12/22/2021. CT 01/11/2022. FINDINGS: The heart size and mediastinal contours are stable with aortic atherosclerosis. The overall volume of the right pleural effusion has decreased from the most recent radiographs, although there is a persistent loculated component within the inferior aspect of the major fissure which has not substantially changed. Small left pleural effusion has nearly completely resolved. There is improved aeration of both lungs. No edema or pneumothorax. No acute osseous findings are seen. Previous lower cervical fusion noted. IMPRESSION: Improving bilateral pleural effusions with persistent loculated component inferiorly in the right major fissure. Electronically Signed   By: Richardean Sale M.D.   On: 01/30/2022 09:41   DG Chest 2 View  Result Date: 01/15/2022 CLINICAL DATA:  Follow-up effusions. EXAM: CHEST - 2 VIEW COMPARISON:  Dec 22, 2021 and January 11, 2022 chest x-rays. CT scans of the chest Dec 22, 2021 and January 11, 2022. FINDINGS: Loculated right pleural fluid with underlying opacity is stable since January 11, 2022. Loculated components of fluid or increased since Dec 22, 2021. A small stable left pleural effusion remains. The effusions are better appreciated on recent CT imaging. No pneumothorax. No nodules or masses. No acute interval infiltrates. The cardiomediastinal silhouette is stable. IMPRESSION: Persistent moderate right pleural effusion with loculated components, unchanged since the January 11, 2022 chest x-ray. Persistent  small left pleural effusion. No interval changes. Electronically Signed   By: Dorise Bullion III M.D.   On: 01/15/2022 13:47       I have independently reviewed the above radiology studies  and reviewed the findings with the patient.   Recent Lab Findings: Lab Results  Component Value Date   WBC 7.7 01/14/2022   HGB 10.4 (L) 01/14/2022   HCT 31.3 (L) 01/14/2022   PLT 314 01/14/2022   GLUCOSE 95 01/14/2022   ALT 16 12/06/2014   AST 21 12/06/2014   NA 143 01/14/2022   K 4.4 01/14/2022   CL 102 01/14/2022   CREATININE 0.77 01/14/2022   BUN 14 01/14/2022   CO2 21 01/14/2022   INR 0.90 09/11/2017       Assessment / Plan:   66 yo female w/ hx of parapneumonic effusion in May of this year.  She continues to have SOB, but this is improving.  I personally reviewed her CT scan.  Her right effusion is improving as well.  It is mostly within the fissure.  There is a small component that is the lateral left base.  Further discussion I do not think that she would benefit from a surgical decortication given the effusion.  Additionally there is not much fluid to remove.  I have made a referral for lung cancer screening given her condition.  She will follow-up with [place].  I  spent 40 minutes with  the patient face to face in counseling and coordination of care.    Lajuana Matte 02/07/2022 12:03 PM

## 2022-02-07 ENCOUNTER — Ambulatory Visit
Admission: RE | Admit: 2022-02-07 | Discharge: 2022-02-07 | Disposition: A | Discharge status: Home / Self Care | Payer: Medicare Other | Source: Ambulatory Visit | Attending: Thoracic Surgery (Cardiothoracic Vascular Surgery) | Admitting: Thoracic Surgery (Cardiothoracic Vascular Surgery)

## 2022-02-07 ENCOUNTER — Institutional Professional Consult (permissible substitution): Payer: Medicare Other | Admitting: Thoracic Surgery (Cardiothoracic Vascular Surgery)

## 2022-02-07 VITALS — BP 138/76 | HR 88 | Resp 18 | Ht 63.0 in | Wt 143.0 lb

## 2022-02-07 DIAGNOSIS — J90 Pleural effusion, not elsewhere classified: Secondary | ICD-10-CM

## 2022-02-07 DIAGNOSIS — I700 Atherosclerosis of aorta: Secondary | ICD-10-CM | POA: Diagnosis not present

## 2022-02-10 ENCOUNTER — Institutional Professional Consult (permissible substitution): Payer: Medicare Other | Admitting: Internal Medicine

## 2022-02-12 DIAGNOSIS — R300 Dysuria: Secondary | ICD-10-CM | POA: Diagnosis not present

## 2022-03-05 DIAGNOSIS — R300 Dysuria: Secondary | ICD-10-CM | POA: Diagnosis not present

## 2022-03-29 DIAGNOSIS — R300 Dysuria: Secondary | ICD-10-CM | POA: Diagnosis not present

## 2022-04-14 DIAGNOSIS — Z6824 Body mass index (BMI) 24.0-24.9, adult: Secondary | ICD-10-CM | POA: Diagnosis not present

## 2022-04-14 DIAGNOSIS — R300 Dysuria: Secondary | ICD-10-CM | POA: Diagnosis not present

## 2022-04-14 DIAGNOSIS — J441 Chronic obstructive pulmonary disease with (acute) exacerbation: Secondary | ICD-10-CM | POA: Diagnosis not present

## 2022-06-05 DIAGNOSIS — R030 Elevated blood-pressure reading, without diagnosis of hypertension: Secondary | ICD-10-CM | POA: Diagnosis not present

## 2022-06-05 DIAGNOSIS — J441 Chronic obstructive pulmonary disease with (acute) exacerbation: Secondary | ICD-10-CM | POA: Diagnosis not present

## 2022-06-09 DIAGNOSIS — J40 Bronchitis, not specified as acute or chronic: Secondary | ICD-10-CM | POA: Diagnosis not present

## 2022-06-09 DIAGNOSIS — J4489 Other specified chronic obstructive pulmonary disease: Secondary | ICD-10-CM | POA: Diagnosis not present

## 2022-06-09 DIAGNOSIS — R9431 Abnormal electrocardiogram [ECG] [EKG]: Secondary | ICD-10-CM | POA: Diagnosis not present

## 2022-06-09 DIAGNOSIS — R059 Cough, unspecified: Secondary | ICD-10-CM | POA: Diagnosis not present

## 2022-06-09 DIAGNOSIS — Z87891 Personal history of nicotine dependence: Secondary | ICD-10-CM | POA: Diagnosis not present

## 2022-06-09 DIAGNOSIS — Z880 Allergy status to penicillin: Secondary | ICD-10-CM | POA: Diagnosis not present

## 2022-06-09 DIAGNOSIS — Z9049 Acquired absence of other specified parts of digestive tract: Secondary | ICD-10-CM | POA: Diagnosis not present

## 2022-06-09 DIAGNOSIS — J9811 Atelectasis: Secondary | ICD-10-CM | POA: Diagnosis not present

## 2022-06-09 DIAGNOSIS — J90 Pleural effusion, not elsewhere classified: Secondary | ICD-10-CM | POA: Diagnosis not present

## 2022-06-09 DIAGNOSIS — R0602 Shortness of breath: Secondary | ICD-10-CM | POA: Diagnosis not present

## 2022-06-09 DIAGNOSIS — R918 Other nonspecific abnormal finding of lung field: Secondary | ICD-10-CM | POA: Diagnosis not present

## 2022-06-09 DIAGNOSIS — R079 Chest pain, unspecified: Secondary | ICD-10-CM | POA: Diagnosis not present

## 2022-06-09 DIAGNOSIS — Z881 Allergy status to other antibiotic agents status: Secondary | ICD-10-CM | POA: Diagnosis not present

## 2022-06-09 DIAGNOSIS — Z7951 Long term (current) use of inhaled steroids: Secondary | ICD-10-CM | POA: Diagnosis not present

## 2022-06-13 DIAGNOSIS — G4700 Insomnia, unspecified: Secondary | ICD-10-CM | POA: Diagnosis not present

## 2022-06-13 DIAGNOSIS — Z720 Tobacco use: Secondary | ICD-10-CM | POA: Diagnosis not present

## 2022-06-13 DIAGNOSIS — K219 Gastro-esophageal reflux disease without esophagitis: Secondary | ICD-10-CM | POA: Diagnosis not present

## 2022-06-13 DIAGNOSIS — R030 Elevated blood-pressure reading, without diagnosis of hypertension: Secondary | ICD-10-CM | POA: Diagnosis not present

## 2022-07-22 DIAGNOSIS — D649 Anemia, unspecified: Secondary | ICD-10-CM | POA: Diagnosis not present

## 2022-07-22 DIAGNOSIS — M791 Myalgia, unspecified site: Secondary | ICD-10-CM | POA: Diagnosis not present

## 2022-07-22 DIAGNOSIS — R300 Dysuria: Secondary | ICD-10-CM | POA: Diagnosis not present

## 2022-07-22 DIAGNOSIS — R030 Elevated blood-pressure reading, without diagnosis of hypertension: Secondary | ICD-10-CM | POA: Diagnosis not present

## 2022-07-22 DIAGNOSIS — R5383 Other fatigue: Secondary | ICD-10-CM | POA: Diagnosis not present

## 2022-08-21 DIAGNOSIS — Z720 Tobacco use: Secondary | ICD-10-CM | POA: Diagnosis not present

## 2022-08-21 DIAGNOSIS — K219 Gastro-esophageal reflux disease without esophagitis: Secondary | ICD-10-CM | POA: Diagnosis not present

## 2022-08-21 DIAGNOSIS — G4700 Insomnia, unspecified: Secondary | ICD-10-CM | POA: Diagnosis not present

## 2022-08-21 DIAGNOSIS — R030 Elevated blood-pressure reading, without diagnosis of hypertension: Secondary | ICD-10-CM | POA: Diagnosis not present

## 2022-09-05 DIAGNOSIS — R059 Cough, unspecified: Secondary | ICD-10-CM | POA: Diagnosis not present

## 2022-09-22 DIAGNOSIS — R030 Elevated blood-pressure reading, without diagnosis of hypertension: Secondary | ICD-10-CM | POA: Diagnosis not present

## 2022-09-22 DIAGNOSIS — N390 Urinary tract infection, site not specified: Secondary | ICD-10-CM | POA: Diagnosis not present

## 2022-09-22 DIAGNOSIS — R300 Dysuria: Secondary | ICD-10-CM | POA: Diagnosis not present

## 2022-09-22 DIAGNOSIS — M62838 Other muscle spasm: Secondary | ICD-10-CM | POA: Diagnosis not present

## 2022-12-27 DIAGNOSIS — J069 Acute upper respiratory infection, unspecified: Secondary | ICD-10-CM | POA: Diagnosis not present

## 2022-12-27 DIAGNOSIS — J441 Chronic obstructive pulmonary disease with (acute) exacerbation: Secondary | ICD-10-CM | POA: Diagnosis not present

## 2022-12-27 DIAGNOSIS — R030 Elevated blood-pressure reading, without diagnosis of hypertension: Secondary | ICD-10-CM | POA: Diagnosis not present

## 2022-12-27 DIAGNOSIS — J0100 Acute maxillary sinusitis, unspecified: Secondary | ICD-10-CM | POA: Diagnosis not present

## 2022-12-29 DIAGNOSIS — M545 Low back pain, unspecified: Secondary | ICD-10-CM | POA: Diagnosis not present

## 2022-12-29 DIAGNOSIS — M5136 Other intervertebral disc degeneration, lumbar region: Secondary | ICD-10-CM | POA: Diagnosis not present

## 2022-12-29 DIAGNOSIS — R252 Cramp and spasm: Secondary | ICD-10-CM | POA: Diagnosis not present

## 2022-12-29 DIAGNOSIS — R768 Other specified abnormal immunological findings in serum: Secondary | ICD-10-CM | POA: Diagnosis not present

## 2022-12-29 DIAGNOSIS — M542 Cervicalgia: Secondary | ICD-10-CM | POA: Diagnosis not present

## 2022-12-29 DIAGNOSIS — G629 Polyneuropathy, unspecified: Secondary | ICD-10-CM | POA: Diagnosis not present

## 2023-01-13 DIAGNOSIS — J441 Chronic obstructive pulmonary disease with (acute) exacerbation: Secondary | ICD-10-CM | POA: Diagnosis not present

## 2023-01-14 DIAGNOSIS — Z1211 Encounter for screening for malignant neoplasm of colon: Secondary | ICD-10-CM | POA: Diagnosis not present

## 2023-02-10 DIAGNOSIS — G629 Polyneuropathy, unspecified: Secondary | ICD-10-CM | POA: Diagnosis not present

## 2023-02-10 DIAGNOSIS — M5136 Other intervertebral disc degeneration, lumbar region: Secondary | ICD-10-CM | POA: Diagnosis not present

## 2023-02-10 DIAGNOSIS — R768 Other specified abnormal immunological findings in serum: Secondary | ICD-10-CM | POA: Diagnosis not present

## 2023-02-10 DIAGNOSIS — M542 Cervicalgia: Secondary | ICD-10-CM | POA: Diagnosis not present

## 2023-02-10 DIAGNOSIS — R252 Cramp and spasm: Secondary | ICD-10-CM | POA: Diagnosis not present

## 2023-03-27 DIAGNOSIS — G4700 Insomnia, unspecified: Secondary | ICD-10-CM | POA: Diagnosis not present

## 2023-03-27 DIAGNOSIS — K219 Gastro-esophageal reflux disease without esophagitis: Secondary | ICD-10-CM | POA: Diagnosis not present

## 2023-04-13 DIAGNOSIS — M545 Low back pain, unspecified: Secondary | ICD-10-CM | POA: Diagnosis not present

## 2023-04-13 DIAGNOSIS — J209 Acute bronchitis, unspecified: Secondary | ICD-10-CM | POA: Diagnosis not present

## 2023-04-13 DIAGNOSIS — R030 Elevated blood-pressure reading, without diagnosis of hypertension: Secondary | ICD-10-CM | POA: Diagnosis not present

## 2023-04-13 DIAGNOSIS — J0101 Acute recurrent maxillary sinusitis: Secondary | ICD-10-CM | POA: Diagnosis not present

## 2023-04-13 DIAGNOSIS — M25559 Pain in unspecified hip: Secondary | ICD-10-CM | POA: Diagnosis not present

## 2023-04-29 DIAGNOSIS — M5410 Radiculopathy, site unspecified: Secondary | ICD-10-CM | POA: Diagnosis not present

## 2023-04-29 DIAGNOSIS — M545 Low back pain, unspecified: Secondary | ICD-10-CM | POA: Diagnosis not present

## 2023-05-27 DIAGNOSIS — J441 Chronic obstructive pulmonary disease with (acute) exacerbation: Secondary | ICD-10-CM | POA: Diagnosis not present

## 2023-05-27 DIAGNOSIS — J069 Acute upper respiratory infection, unspecified: Secondary | ICD-10-CM | POA: Diagnosis not present

## 2023-07-17 DIAGNOSIS — J441 Chronic obstructive pulmonary disease with (acute) exacerbation: Secondary | ICD-10-CM | POA: Diagnosis not present

## 2023-07-17 DIAGNOSIS — R509 Fever, unspecified: Secondary | ICD-10-CM | POA: Diagnosis not present

## 2023-07-17 DIAGNOSIS — R059 Cough, unspecified: Secondary | ICD-10-CM | POA: Diagnosis not present

## 2023-07-17 DIAGNOSIS — R030 Elevated blood-pressure reading, without diagnosis of hypertension: Secondary | ICD-10-CM | POA: Diagnosis not present

## 2023-07-27 DIAGNOSIS — J441 Chronic obstructive pulmonary disease with (acute) exacerbation: Secondary | ICD-10-CM | POA: Diagnosis not present

## 2023-07-27 DIAGNOSIS — R300 Dysuria: Secondary | ICD-10-CM | POA: Diagnosis not present

## 2023-07-27 DIAGNOSIS — R030 Elevated blood-pressure reading, without diagnosis of hypertension: Secondary | ICD-10-CM | POA: Diagnosis not present

## 2023-08-05 DIAGNOSIS — R030 Elevated blood-pressure reading, without diagnosis of hypertension: Secondary | ICD-10-CM | POA: Diagnosis not present

## 2023-08-05 DIAGNOSIS — J441 Chronic obstructive pulmonary disease with (acute) exacerbation: Secondary | ICD-10-CM | POA: Diagnosis not present

## 2023-08-05 DIAGNOSIS — R059 Cough, unspecified: Secondary | ICD-10-CM | POA: Diagnosis not present

## 2023-09-18 DIAGNOSIS — J45909 Unspecified asthma, uncomplicated: Secondary | ICD-10-CM | POA: Diagnosis not present

## 2023-09-22 DIAGNOSIS — F17210 Nicotine dependence, cigarettes, uncomplicated: Secondary | ICD-10-CM | POA: Diagnosis not present

## 2023-09-22 DIAGNOSIS — Z20822 Contact with and (suspected) exposure to covid-19: Secondary | ICD-10-CM | POA: Diagnosis not present

## 2023-09-22 DIAGNOSIS — R0989 Other specified symptoms and signs involving the circulatory and respiratory systems: Secondary | ICD-10-CM | POA: Diagnosis not present

## 2023-09-22 DIAGNOSIS — R509 Fever, unspecified: Secondary | ICD-10-CM | POA: Diagnosis not present

## 2023-09-22 DIAGNOSIS — R059 Cough, unspecified: Secondary | ICD-10-CM | POA: Diagnosis not present

## 2023-09-22 DIAGNOSIS — J449 Chronic obstructive pulmonary disease, unspecified: Secondary | ICD-10-CM | POA: Diagnosis not present

## 2023-09-22 DIAGNOSIS — Z720 Tobacco use: Secondary | ICD-10-CM | POA: Diagnosis not present

## 2023-09-22 DIAGNOSIS — R918 Other nonspecific abnormal finding of lung field: Secondary | ICD-10-CM | POA: Diagnosis not present

## 2023-12-08 DIAGNOSIS — J441 Chronic obstructive pulmonary disease with (acute) exacerbation: Secondary | ICD-10-CM | POA: Diagnosis not present

## 2023-12-08 DIAGNOSIS — R051 Acute cough: Secondary | ICD-10-CM | POA: Diagnosis not present

## 2023-12-16 DIAGNOSIS — Z720 Tobacco use: Secondary | ICD-10-CM | POA: Diagnosis not present

## 2023-12-16 DIAGNOSIS — J441 Chronic obstructive pulmonary disease with (acute) exacerbation: Secondary | ICD-10-CM | POA: Diagnosis not present

## 2024-01-26 DIAGNOSIS — J441 Chronic obstructive pulmonary disease with (acute) exacerbation: Secondary | ICD-10-CM | POA: Diagnosis not present

## 2024-01-26 DIAGNOSIS — J209 Acute bronchitis, unspecified: Secondary | ICD-10-CM | POA: Diagnosis not present

## 2024-02-25 DIAGNOSIS — R0602 Shortness of breath: Secondary | ICD-10-CM | POA: Diagnosis not present

## 2024-02-25 DIAGNOSIS — R051 Acute cough: Secondary | ICD-10-CM | POA: Diagnosis not present

## 2024-03-02 DIAGNOSIS — J441 Chronic obstructive pulmonary disease with (acute) exacerbation: Secondary | ICD-10-CM | POA: Diagnosis not present

## 2024-03-02 DIAGNOSIS — R051 Acute cough: Secondary | ICD-10-CM | POA: Diagnosis not present

## 2024-03-02 DIAGNOSIS — J45909 Unspecified asthma, uncomplicated: Secondary | ICD-10-CM | POA: Diagnosis not present

## 2024-03-12 DIAGNOSIS — I2510 Atherosclerotic heart disease of native coronary artery without angina pectoris: Secondary | ICD-10-CM | POA: Diagnosis not present

## 2024-03-12 DIAGNOSIS — R0602 Shortness of breath: Secondary | ICD-10-CM | POA: Diagnosis not present

## 2024-03-12 DIAGNOSIS — J449 Chronic obstructive pulmonary disease, unspecified: Secondary | ICD-10-CM | POA: Diagnosis not present

## 2024-03-12 DIAGNOSIS — Z79899 Other long term (current) drug therapy: Secondary | ICD-10-CM | POA: Diagnosis not present

## 2024-03-12 DIAGNOSIS — Z7951 Long term (current) use of inhaled steroids: Secondary | ICD-10-CM | POA: Diagnosis not present

## 2024-03-12 DIAGNOSIS — Z881 Allergy status to other antibiotic agents status: Secondary | ICD-10-CM | POA: Diagnosis not present

## 2024-03-12 DIAGNOSIS — Z7901 Long term (current) use of anticoagulants: Secondary | ICD-10-CM | POA: Diagnosis not present

## 2024-03-12 DIAGNOSIS — R7989 Other specified abnormal findings of blood chemistry: Secondary | ICD-10-CM | POA: Diagnosis not present

## 2024-03-12 DIAGNOSIS — Z9981 Dependence on supplemental oxygen: Secondary | ICD-10-CM | POA: Diagnosis not present

## 2024-03-12 DIAGNOSIS — I509 Heart failure, unspecified: Secondary | ICD-10-CM | POA: Diagnosis not present

## 2024-03-12 DIAGNOSIS — Z880 Allergy status to penicillin: Secondary | ICD-10-CM | POA: Diagnosis not present

## 2024-03-12 DIAGNOSIS — E876 Hypokalemia: Secondary | ICD-10-CM | POA: Diagnosis not present

## 2024-03-12 DIAGNOSIS — R918 Other nonspecific abnormal finding of lung field: Secondary | ICD-10-CM | POA: Diagnosis not present

## 2024-03-12 DIAGNOSIS — J90 Pleural effusion, not elsewhere classified: Secondary | ICD-10-CM | POA: Diagnosis not present

## 2024-03-12 DIAGNOSIS — J9811 Atelectasis: Secondary | ICD-10-CM | POA: Diagnosis not present

## 2024-03-12 DIAGNOSIS — D649 Anemia, unspecified: Secondary | ICD-10-CM | POA: Diagnosis not present

## 2024-03-12 DIAGNOSIS — R0600 Dyspnea, unspecified: Secondary | ICD-10-CM | POA: Diagnosis not present

## 2024-03-13 DIAGNOSIS — J449 Chronic obstructive pulmonary disease, unspecified: Secondary | ICD-10-CM | POA: Diagnosis not present

## 2024-03-13 DIAGNOSIS — R7989 Other specified abnormal findings of blood chemistry: Secondary | ICD-10-CM | POA: Diagnosis not present

## 2024-03-13 DIAGNOSIS — I2510 Atherosclerotic heart disease of native coronary artery without angina pectoris: Secondary | ICD-10-CM | POA: Diagnosis not present

## 2024-03-13 DIAGNOSIS — J918 Pleural effusion in other conditions classified elsewhere: Secondary | ICD-10-CM | POA: Diagnosis not present

## 2024-03-13 DIAGNOSIS — Z79899 Other long term (current) drug therapy: Secondary | ICD-10-CM | POA: Diagnosis not present

## 2024-03-13 DIAGNOSIS — E876 Hypokalemia: Secondary | ICD-10-CM | POA: Diagnosis not present

## 2024-03-23 DIAGNOSIS — I272 Pulmonary hypertension, unspecified: Secondary | ICD-10-CM | POA: Diagnosis not present

## 2024-03-23 DIAGNOSIS — I083 Combined rheumatic disorders of mitral, aortic and tricuspid valves: Secondary | ICD-10-CM | POA: Diagnosis not present

## 2024-03-23 DIAGNOSIS — J810 Acute pulmonary edema: Secondary | ICD-10-CM | POA: Diagnosis not present

## 2024-03-25 DIAGNOSIS — I509 Heart failure, unspecified: Secondary | ICD-10-CM | POA: Diagnosis not present

## 2024-03-25 DIAGNOSIS — J441 Chronic obstructive pulmonary disease with (acute) exacerbation: Secondary | ICD-10-CM | POA: Diagnosis not present

## 2024-03-25 DIAGNOSIS — E876 Hypokalemia: Secondary | ICD-10-CM | POA: Diagnosis not present

## 2024-04-11 DIAGNOSIS — I429 Cardiomyopathy, unspecified: Secondary | ICD-10-CM | POA: Diagnosis not present

## 2024-04-12 DIAGNOSIS — J441 Chronic obstructive pulmonary disease with (acute) exacerbation: Secondary | ICD-10-CM | POA: Diagnosis not present

## 2024-04-12 DIAGNOSIS — B370 Candidal stomatitis: Secondary | ICD-10-CM | POA: Diagnosis not present

## 2024-04-12 DIAGNOSIS — E876 Hypokalemia: Secondary | ICD-10-CM | POA: Diagnosis not present

## 2024-04-12 DIAGNOSIS — I509 Heart failure, unspecified: Secondary | ICD-10-CM | POA: Diagnosis not present

## 2024-04-22 DIAGNOSIS — I429 Cardiomyopathy, unspecified: Secondary | ICD-10-CM | POA: Diagnosis not present

## 2024-04-22 DIAGNOSIS — J449 Chronic obstructive pulmonary disease, unspecified: Secondary | ICD-10-CM | POA: Diagnosis not present

## 2024-04-22 DIAGNOSIS — J90 Pleural effusion, not elsewhere classified: Secondary | ICD-10-CM | POA: Diagnosis not present

## 2024-04-22 DIAGNOSIS — I5022 Chronic systolic (congestive) heart failure: Secondary | ICD-10-CM | POA: Diagnosis not present

## 2024-04-22 DIAGNOSIS — Z8249 Family history of ischemic heart disease and other diseases of the circulatory system: Secondary | ICD-10-CM | POA: Diagnosis not present

## 2024-04-22 DIAGNOSIS — Z880 Allergy status to penicillin: Secondary | ICD-10-CM | POA: Diagnosis not present

## 2024-04-22 DIAGNOSIS — I2510 Atherosclerotic heart disease of native coronary artery without angina pectoris: Secondary | ICD-10-CM | POA: Diagnosis not present

## 2024-04-22 DIAGNOSIS — F17200 Nicotine dependence, unspecified, uncomplicated: Secondary | ICD-10-CM | POA: Diagnosis not present

## 2024-04-22 DIAGNOSIS — R0602 Shortness of breath: Secondary | ICD-10-CM | POA: Diagnosis not present

## 2024-04-22 DIAGNOSIS — R0789 Other chest pain: Secondary | ICD-10-CM | POA: Diagnosis not present

## 2024-04-22 DIAGNOSIS — Z7951 Long term (current) use of inhaled steroids: Secondary | ICD-10-CM | POA: Diagnosis not present
